# Patient Record
Sex: Female | Born: 1950
Health system: Southern US, Community
[De-identification: ages and names within clinical notes are randomized; demographics above are authoritative.]

## PROBLEM LIST (undated history)

## (undated) DIAGNOSIS — M109 Gout, unspecified: Secondary | ICD-10-CM

## (undated) DIAGNOSIS — E7800 Pure hypercholesterolemia, unspecified: Secondary | ICD-10-CM

## (undated) DIAGNOSIS — I10 Essential (primary) hypertension: Secondary | ICD-10-CM

## (undated) DIAGNOSIS — E119 Type 2 diabetes mellitus without complications: Secondary | ICD-10-CM

## (undated) HISTORY — PX: CHOLECYSTECTOMY: SHX55

## (undated) HISTORY — DX: Essential (primary) hypertension: I10

## (undated) HISTORY — DX: Pure hypercholesterolemia, unspecified: E78.00

---

## 2000-10-26 ENCOUNTER — Ambulatory Visit (HOSPITAL_COMMUNITY): Admission: RE | Admit: 2000-10-26 | Discharge: 2000-10-26 | Payer: Self-pay | Admitting: Family Medicine

## 2000-10-26 ENCOUNTER — Encounter: Payer: Self-pay | Admitting: Family Medicine

## 2001-01-10 HISTORY — PX: COLONOSCOPY: SHX174

## 2001-05-15 ENCOUNTER — Ambulatory Visit (HOSPITAL_COMMUNITY): Admission: RE | Admit: 2001-05-15 | Discharge: 2001-05-15 | Payer: Self-pay | Admitting: Internal Medicine

## 2002-08-23 ENCOUNTER — Encounter: Payer: Self-pay | Admitting: Family Medicine

## 2002-08-23 ENCOUNTER — Ambulatory Visit (HOSPITAL_COMMUNITY): Admission: RE | Admit: 2002-08-23 | Discharge: 2002-08-23 | Payer: Self-pay | Admitting: Family Medicine

## 2003-06-10 ENCOUNTER — Encounter (HOSPITAL_COMMUNITY): Admission: RE | Admit: 2003-06-10 | Discharge: 2003-06-11 | Payer: Self-pay | Admitting: Family Medicine

## 2005-03-26 ENCOUNTER — Ambulatory Visit (HOSPITAL_COMMUNITY): Admission: RE | Admit: 2005-03-26 | Discharge: 2005-03-26 | Payer: Self-pay | Admitting: Internal Medicine

## 2005-08-16 ENCOUNTER — Ambulatory Visit (HOSPITAL_COMMUNITY): Admission: RE | Admit: 2005-08-16 | Discharge: 2005-08-16 | Payer: Self-pay | Admitting: Family Medicine

## 2007-09-21 ENCOUNTER — Ambulatory Visit (HOSPITAL_COMMUNITY): Admission: RE | Admit: 2007-09-21 | Discharge: 2007-09-21 | Payer: Self-pay | Admitting: Family Medicine

## 2008-10-31 ENCOUNTER — Ambulatory Visit (HOSPITAL_COMMUNITY): Admission: RE | Admit: 2008-10-31 | Discharge: 2008-10-31 | Payer: Self-pay | Admitting: Family Medicine

## 2010-05-28 NOTE — Op Note (Signed)
[facility]  Patient:    Beverly Foley, Beverly Foley Visit Number: [id number] MRN: [id number]          Service Type: DSU Location: DAY Attending Physician:  Jonathon Bellows Dictated by:   Roetta Sessions, M.D. Proc. Date: 05/15/01 Admit Date:  05/15/2001   CC:         [name], M.D., [place], [place].   Operative Report  PROCEDURE:  Colonoscopy with biopsy.  INDICATIONS FOR PROCEDURE:  The patient is a 60 year old lady kindly referred through the courtesy of Dr. [name] to further evaluate Hemoccult positive stool and to take a recent rectal examination. Beverly Foley tells me she has seen small volume of blood per rectum with stooling on occasion for a year or so. She has occasional diarrhea but has generally normal formed bowel movements. Colonoscopy is now being done. This approach has been discussed with Beverly Foley. The potential risks, benefits, and alternatives have been reviewed and questions answered. She is low risk for conscious sedation. Please see my May 02, 2001 consultation note for more information.  MONITORING:  O2 saturations, blood pressure, pulse and respirations were monitored throughout the entire procedure.  CONSCIOUS SEDATION:  Versed 3 mg IV, Demerol 75 IV in divided doses.  INSTRUMENT:  Olympus video chip colonoscope.  FINDINGS:  Digital rectal exam revealed no abnormalities.  ENDOSCOPIC FINDINGS:  The prep was good.  RECTUM:  Examination of the rectal mucosal including retroflexed view of the anal verge revealed a couple of anal papillae and internal hemorrhoids otherwise the rectal mucosa appeared normal.  COLON:  The colonic mucosa was surveyed from the rectosigmoid junction through the left transverse right colon to the area of the appendiceal orifice, ileocecal valve, and cecum. These structures were well seen and appeared normal as did the remainder of the colon although I do note a little more friability than is  normally seen. Again vascular pattern of the colon was maintained. There were no focal areas of erosion, ulceration or other abnormality. From the level of the cecum and ileocecal valve, the scope was slowly withdrawn. All previously mentioned mucosal surfaces were again seen, no abnormalities were observed. I elected to take two biopsies of the sigmoid colon to rule out microscopic colitis. The patient tolerated the procedure well and was reactive in endoscopy.  IMPRESSION:  1. Internal hemorrhoids/anal papillae. Otherwise normal rectum.  2. Endoscopically normal appearing colonic mucosa all the way to the     cecum although there was some increased friability diffusely. Biopsy     of the left colon taken as described above.  RECOMMENDATIONS:  1. Go ahead and treat her empirically for hemorrhoids.  2. Daily Metamucil or Citrucel fiber supplement.  3. Anusol-HC suppositories one per rectum at bedtime. Follow-up on the     path.  4. Further recommendations to follow. Dictated by:   Roetta Sessions, M.D. Attending Physician:  Jonathon Bellows DD:  05/15/01 TD:  05/16/01 Job: [id number]

## 2011-09-14 ENCOUNTER — Other Ambulatory Visit (HOSPITAL_COMMUNITY): Payer: Self-pay | Admitting: Family Medicine

## 2011-09-14 DIAGNOSIS — R221 Localized swelling, mass and lump, neck: Secondary | ICD-10-CM

## 2011-09-16 ENCOUNTER — Other Ambulatory Visit (HOSPITAL_COMMUNITY): Payer: Self-pay | Admitting: Family Medicine

## 2011-09-16 ENCOUNTER — Ambulatory Visit (HOSPITAL_COMMUNITY)
Admission: RE | Admit: 2011-09-16 | Discharge: 2011-09-16 | Disposition: A | Discharge status: Home / Self Care | Payer: BC Managed Care – PPO | Source: Ambulatory Visit | Attending: Family Medicine | Admitting: Family Medicine

## 2011-09-16 DIAGNOSIS — R220 Localized swelling, mass and lump, head: Secondary | ICD-10-CM | POA: Insufficient documentation

## 2011-09-16 DIAGNOSIS — R221 Localized swelling, mass and lump, neck: Secondary | ICD-10-CM

## 2012-05-30 ENCOUNTER — Emergency Department (HOSPITAL_COMMUNITY): Payer: Worker's Compensation

## 2012-05-30 ENCOUNTER — Encounter (HOSPITAL_COMMUNITY): Payer: Self-pay

## 2012-05-30 ENCOUNTER — Emergency Department (HOSPITAL_COMMUNITY)
Admission: EM | Admit: 2012-05-30 | Discharge: 2012-05-30 | Disposition: A | Discharge status: Home / Self Care | Payer: Worker's Compensation | Attending: Emergency Medicine | Admitting: Emergency Medicine

## 2012-05-30 DIAGNOSIS — S52123A Displaced fracture of head of unspecified radius, initial encounter for closed fracture: Secondary | ICD-10-CM | POA: Insufficient documentation

## 2012-05-30 DIAGNOSIS — Y9302 Activity, running: Secondary | ICD-10-CM | POA: Insufficient documentation

## 2012-05-30 DIAGNOSIS — Z79899 Other long term (current) drug therapy: Secondary | ICD-10-CM | POA: Insufficient documentation

## 2012-05-30 DIAGNOSIS — E119 Type 2 diabetes mellitus without complications: Secondary | ICD-10-CM | POA: Insufficient documentation

## 2012-05-30 DIAGNOSIS — Y9239 Other specified sports and athletic area as the place of occurrence of the external cause: Secondary | ICD-10-CM | POA: Insufficient documentation

## 2012-05-30 DIAGNOSIS — W010XXA Fall on same level from slipping, tripping and stumbling without subsequent striking against object, initial encounter: Secondary | ICD-10-CM | POA: Insufficient documentation

## 2012-05-30 DIAGNOSIS — S52121A Displaced fracture of head of right radius, initial encounter for closed fracture: Secondary | ICD-10-CM

## 2012-05-30 HISTORY — DX: Type 2 diabetes mellitus without complications: E11.9

## 2012-05-30 MED ORDER — HYDROCODONE-ACETAMINOPHEN 5-325 MG PO TABS: ORAL_TABLET | ORAL | Status: DC

## 2012-05-30 NOTE — ED Notes (Signed)
Pt states she was running and fell on her right arm.

## 2012-05-30 NOTE — ED Provider Notes (Signed)
History     CSN: [id number]  Arrival date & time 05/30/12  1308   First MD Initiated Contact with Patient 05/30/12 1949      Chief Complaint  Patient presents with  . Arm Injury     HPI Pt was seen at 2000.  Per pt, c/o gradual onset and persistence of constant right elbow and forearm "pain" that began this afternoon PTA.  Pt states she was "playing ball" with some children and was running when she tripped and fell, landing on her outstretched right arm.  Pt is right handed.  Denies shoulder/wrist/hand pain, no neck pain, no head injury/LOC, no CP/SOB, no abd pain, no focal motor weakness, no tingling/numbness in extremities.      Past Medical History  Diagnosis Date  . Diabetes mellitus without complication     Past Surgical History  Procedure Laterality Date  . Cholecystectomy      History  Substance Use Topics  . Smoking status: Never Smoker   . Smokeless tobacco: Not on file  . Alcohol Use: No      Review of Systems ROS: Statement: All systems negative except as marked or noted in the HPI; Constitutional: Negative for fever and chills. ; ; Eyes: Negative for eye pain, redness and discharge. ; ; ENMT: Negative for ear pain, hoarseness, nasal congestion, sinus pressure and sore throat. ; ; Cardiovascular: Negative for chest pain, palpitations, diaphoresis, dyspnea and peripheral edema. ; ; Respiratory: Negative for cough, wheezing and stridor. ; ; Gastrointestinal: Negative for nausea, vomiting, diarrhea, abdominal pain, blood in stool, hematemesis, jaundice and rectal bleeding. . ; ; Genitourinary: Negative for dysuria, flank pain and hematuria. ; ; Musculoskeletal: +right forearm and elbow pain. Negative for back pain and neck pain. Negative for swelling and deformity.; ; Skin: Negative for pruritus, rash, abrasions, blisters, bruising and skin lesion.; ; Neuro: Negative for headache, lightheadedness and neck stiffness. Negative for weakness, altered level of consciousness  , altered mental status, extremity weakness, paresthesias, involuntary movement, seizure and syncope.       Allergies  Augmentin; Codeine; and Levaquin  Home Medications   Current Outpatient Rx  Name  Route  Sig  Dispense  Refill  . Canagliflozin (INVOKANA) 100 MG TABS   Oral   Take 1 tablet by mouth daily.         Marland Kitchen lisinopril-hydrochlorothiazide (PRINZIDE,ZESTORETIC) 20-25 MG per tablet   Oral   Take 1 tablet by mouth daily.         . Saxagliptin-Metformin (KOMBIGLYZE XR) 2.05-998 MG TB24   Oral   Take 1 tablet by mouth 2 (two) times daily.         . vitamin C (ASCORBIC ACID) 500 MG tablet   Oral   Take 500 mg by mouth daily.           BP 132/78  Pulse 91  Temp(Src) 98.2 F (36.8 C) (Oral)  Resp 20  Ht 5\' 1"  (1.549 m)  Wt 226 lb (102.513 kg)  BMI 42.72 kg/m2  SpO2 97%  Physical Exam 2005: Physical examination:  Nursing notes reviewed; Vital signs and O2 SAT reviewed;  Constitutional: Well developed, Well nourished, Well hydrated, In no acute distress; Head:  Normocephalic, atraumatic; Eyes: EOMI, PERRL, No scleral icterus; ENMT: Mouth and pharynx normal, Mucous membranes moist; Neck: Supple, Full range of motion, No lymphadenopathy; Cardiovascular: Regular rate and rhythm, No gallop; Respiratory: Breath sounds clear & equal bilaterally, No rales, rhonchi, wheezes.  Speaking full sentences with ease, Normal respiratory  effort/excursion; Chest: Nontender, Movement normal; Abdomen: Soft, Nontender, Nondistended, Normal bowel sounds;; Extremities: Pulses normal, +TTP right proximal radial forearm area as well as right lateral elbow, pain increases with pronation/supination of right wrist. NMS intact right hand. Strong radial pulse. NT right clavicle/shoulder/wrist/hand. No open wounds, no deformity, no ecchymosis, no erythema, no edema.; Neuro: AA&Ox3, Major CN grossly intact.  Speech clear. Climbs on and off stretcher easily by herself. Gait steady. No gross focal  motor or sensory deficits in extremities.; Skin: Color normal, Warm, Dry.    ED Course  Procedures    MDM  MDM Reviewed: previous chart, nursing note and vitals Interpretation: x-ray   Dg Elbow Complete Right 05/30/2012   *RADIOLOGY REPORT*  Clinical Data: Fall  RIGHT ELBOW - COMPLETE 3+ VIEW  Comparison: None.  Findings: Fracture the radial head with mild displacement.  No other fracture or degenerative change.  IMPRESSION: Radial head fracture.   Original Report Authenticated By: Janeece Riggers, M.D.   Dg Forearm Right 05/30/2012   *RADIOLOGY REPORT*  Clinical Data:   Fall  RIGHT FOREARM - 2 VIEW  Comparison:  None.  Findings: There is no evidence of fracture or other focal bone lesions.  Soft tissues are unremarkable.  IMPRESSION: Negative.   Original Report Authenticated By: Janeece Riggers, M.D.     2045:  Will apply long arm posterior splint/sling; f/u Ortho MD.  Dx and testing d/w pt.  Questions answered.  Verb understanding, agreeable to d/c home with outpt f/u.       Laray Anger, DO 06/01/12 1821

## 2013-04-10 ENCOUNTER — Other Ambulatory Visit (HOSPITAL_COMMUNITY): Payer: Self-pay | Admitting: Family Medicine

## 2013-04-10 ENCOUNTER — Ambulatory Visit (HOSPITAL_COMMUNITY)
Admission: RE | Admit: 2013-04-10 | Discharge: 2013-04-10 | Disposition: A | Discharge status: Home / Self Care | Payer: BC Managed Care – PPO | Source: Ambulatory Visit | Attending: Family Medicine | Admitting: Family Medicine

## 2013-04-10 DIAGNOSIS — M25569 Pain in unspecified knee: Secondary | ICD-10-CM | POA: Insufficient documentation

## 2013-04-10 DIAGNOSIS — R52 Pain, unspecified: Secondary | ICD-10-CM

## 2013-04-10 DIAGNOSIS — I10 Essential (primary) hypertension: Secondary | ICD-10-CM | POA: Insufficient documentation

## 2013-04-10 DIAGNOSIS — M898X9 Other specified disorders of bone, unspecified site: Secondary | ICD-10-CM | POA: Insufficient documentation

## 2014-11-14 IMAGING — CR DG CHEST 2V
2 series · 2 of 2 positions shown · non-contrast
Comparison: 08/16/2005

CLINICAL DATA: Routine physical exam

EXAM:
CHEST  2 VIEW

[view not recorded (1 of 2)]
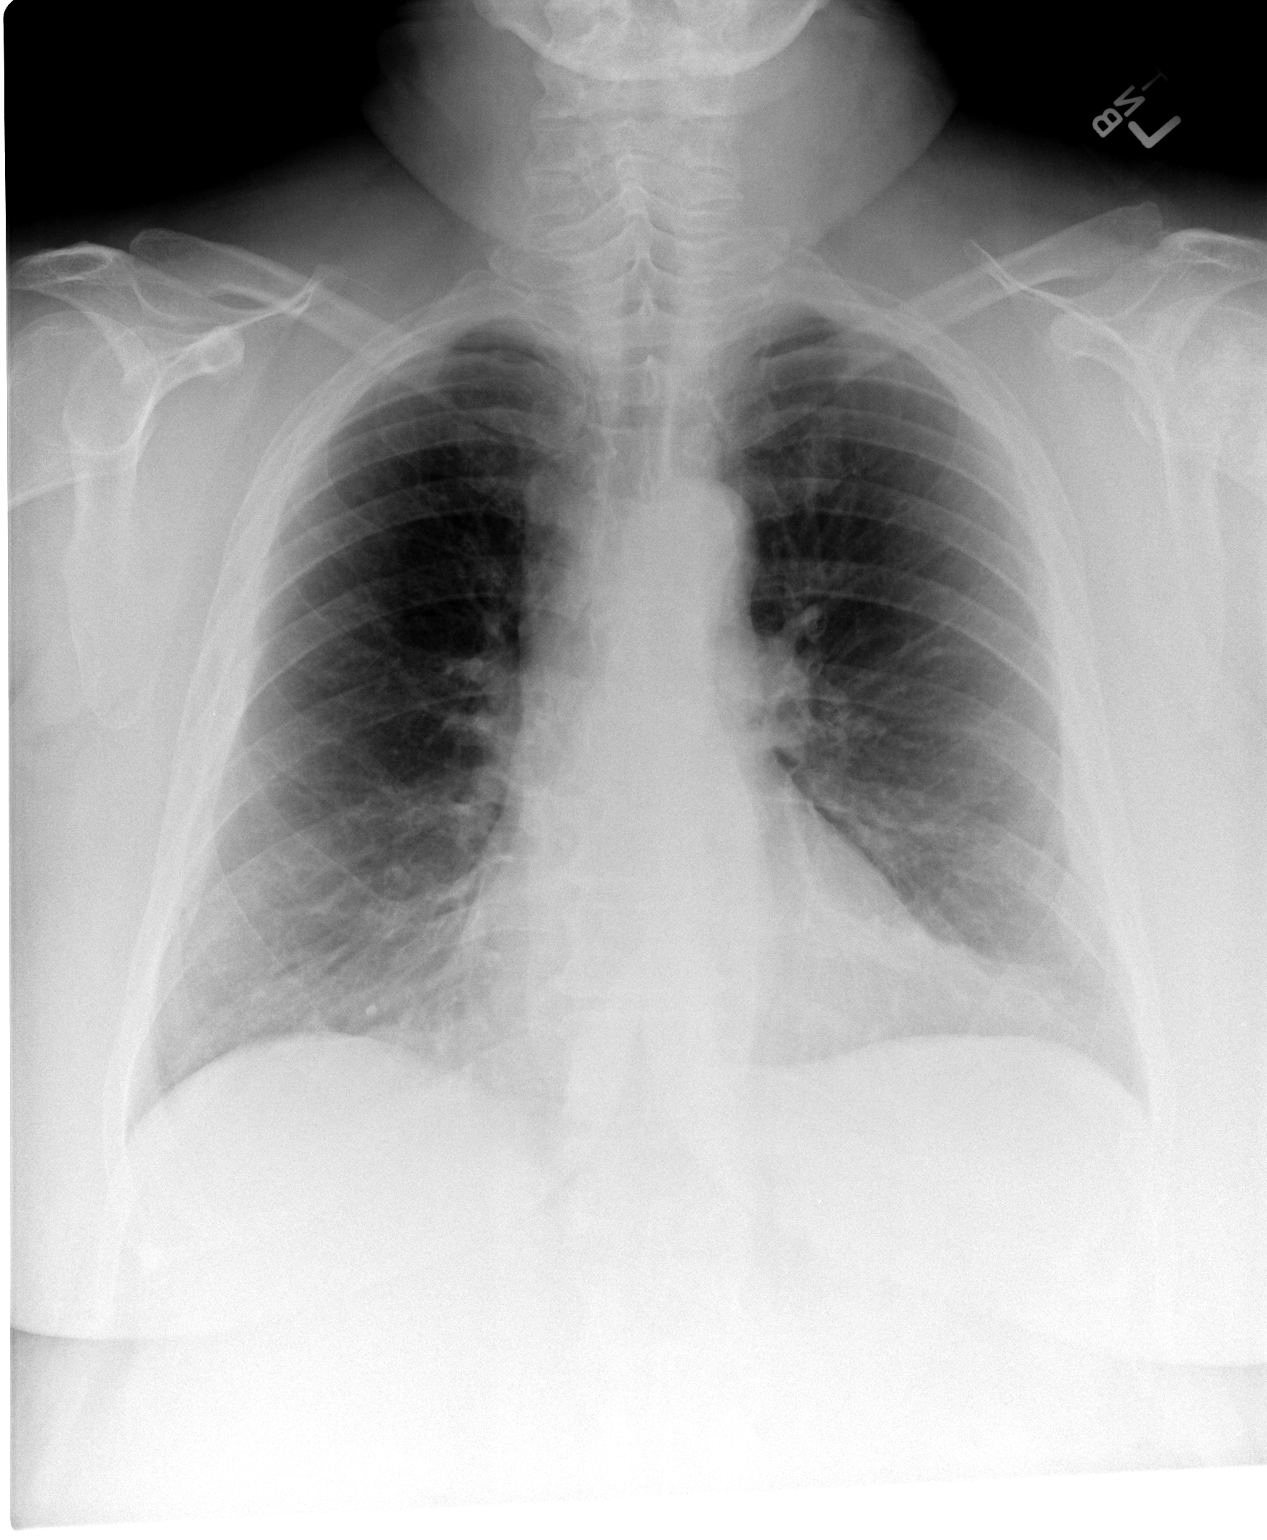

[view not recorded (2 of 2)]
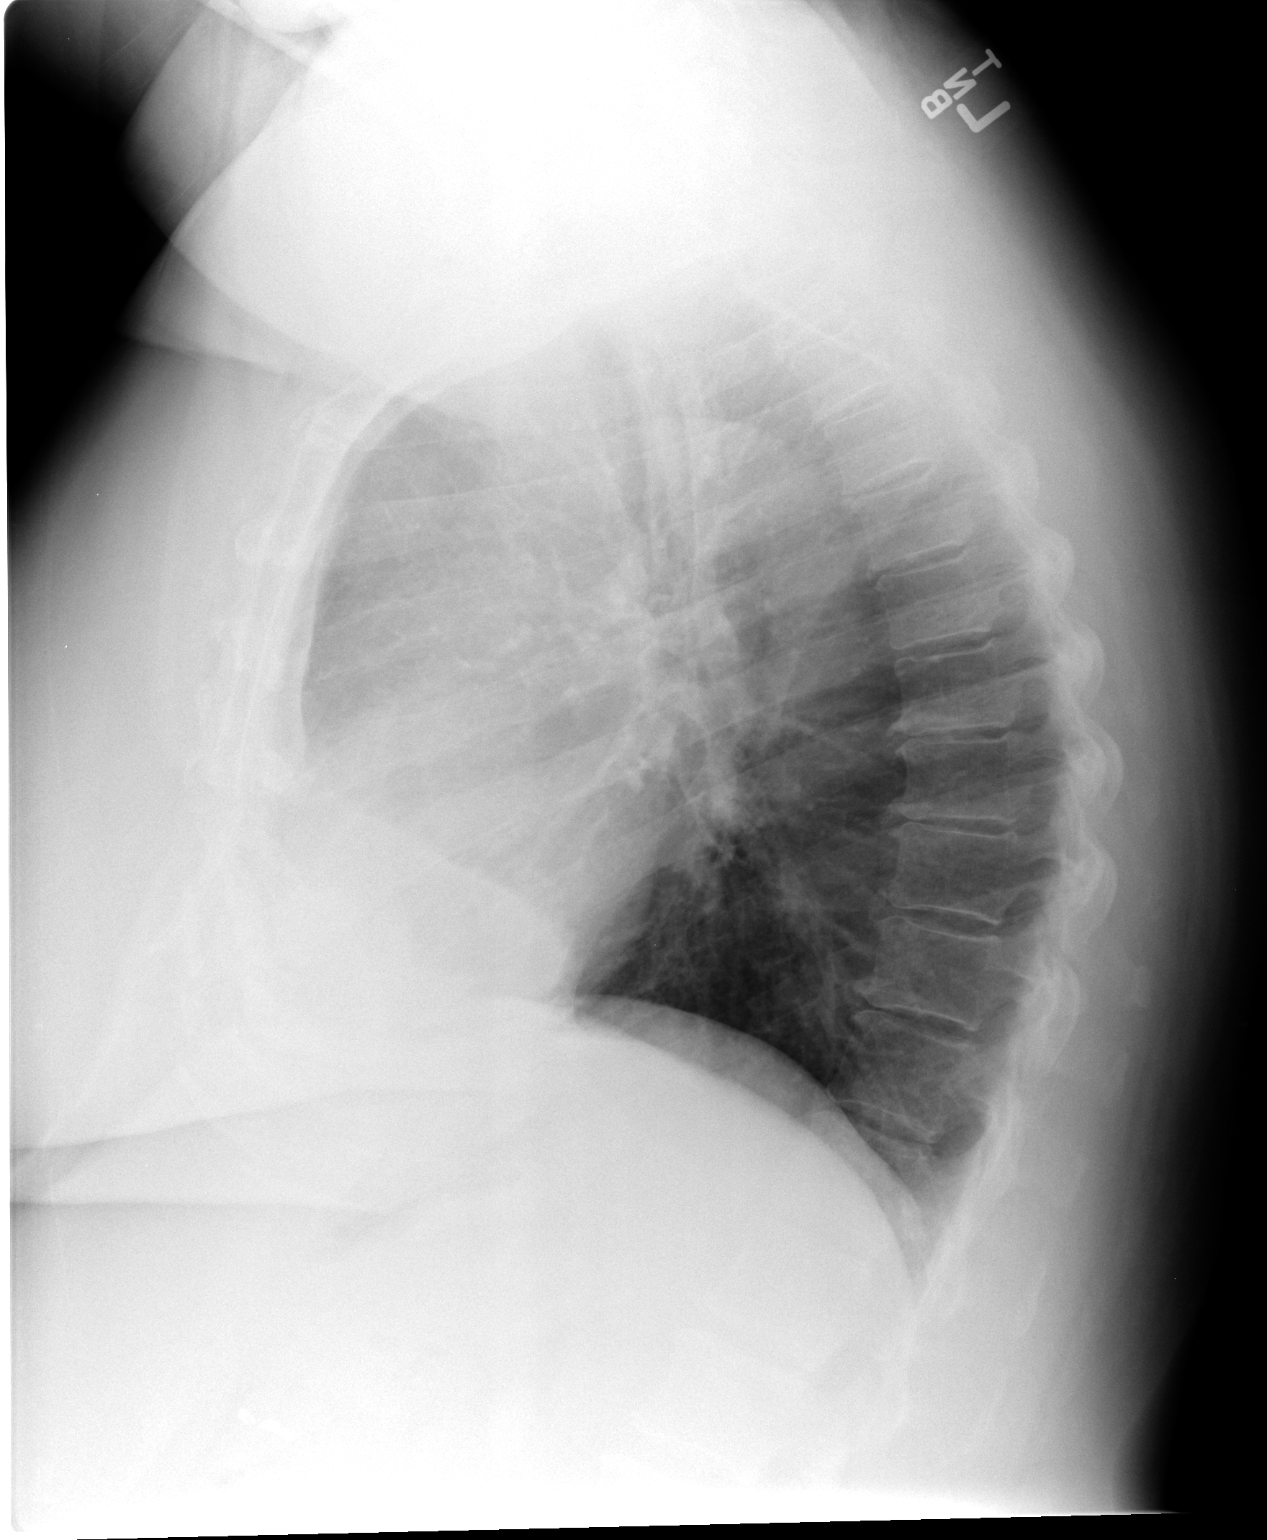

[2 of 2 positions shown; findings below may reference images not displayed]

FINDINGS: The heart size and mediastinal contours are within normal limits.
Both lungs are clear. The visualized skeletal structures are
unremarkable.
IMPRESSION: No active cardiopulmonary disease.

## 2015-05-28 ENCOUNTER — Ambulatory Visit (INDEPENDENT_AMBULATORY_CARE_PROVIDER_SITE_OTHER): Payer: BC Managed Care – PPO | Admitting: Otolaryngology

## 2015-05-28 DIAGNOSIS — K219 Gastro-esophageal reflux disease without esophagitis: Secondary | ICD-10-CM | POA: Diagnosis not present

## 2015-05-28 DIAGNOSIS — R490 Dysphonia: Secondary | ICD-10-CM | POA: Diagnosis not present

## 2015-05-28 DIAGNOSIS — D440 Neoplasm of uncertain behavior of thyroid gland: Secondary | ICD-10-CM

## 2015-10-26 DIAGNOSIS — I10 Essential (primary) hypertension: Secondary | ICD-10-CM | POA: Diagnosis not present

## 2015-10-26 DIAGNOSIS — E041 Nontoxic single thyroid nodule: Secondary | ICD-10-CM | POA: Diagnosis not present

## 2015-10-26 DIAGNOSIS — E1165 Type 2 diabetes mellitus with hyperglycemia: Secondary | ICD-10-CM | POA: Diagnosis not present

## 2015-10-28 DIAGNOSIS — I10 Essential (primary) hypertension: Secondary | ICD-10-CM | POA: Diagnosis not present

## 2015-10-28 DIAGNOSIS — E041 Nontoxic single thyroid nodule: Secondary | ICD-10-CM | POA: Diagnosis not present

## 2015-10-28 DIAGNOSIS — Z6839 Body mass index (BMI) 39.0-39.9, adult: Secondary | ICD-10-CM | POA: Diagnosis not present

## 2015-10-28 DIAGNOSIS — Z23 Encounter for immunization: Secondary | ICD-10-CM | POA: Diagnosis not present

## 2015-10-28 DIAGNOSIS — E1165 Type 2 diabetes mellitus with hyperglycemia: Secondary | ICD-10-CM | POA: Diagnosis not present

## 2015-10-28 DIAGNOSIS — M109 Gout, unspecified: Secondary | ICD-10-CM | POA: Diagnosis not present

## 2016-02-22 DIAGNOSIS — I10 Essential (primary) hypertension: Secondary | ICD-10-CM | POA: Diagnosis not present

## 2016-02-22 DIAGNOSIS — E1165 Type 2 diabetes mellitus with hyperglycemia: Secondary | ICD-10-CM | POA: Diagnosis not present

## 2016-02-22 DIAGNOSIS — E041 Nontoxic single thyroid nodule: Secondary | ICD-10-CM | POA: Diagnosis not present

## 2016-02-22 DIAGNOSIS — L0390 Cellulitis, unspecified: Secondary | ICD-10-CM | POA: Diagnosis not present

## 2016-03-02 DIAGNOSIS — E041 Nontoxic single thyroid nodule: Secondary | ICD-10-CM | POA: Diagnosis not present

## 2016-03-02 DIAGNOSIS — E782 Mixed hyperlipidemia: Secondary | ICD-10-CM | POA: Diagnosis not present

## 2016-03-02 DIAGNOSIS — E1165 Type 2 diabetes mellitus with hyperglycemia: Secondary | ICD-10-CM | POA: Diagnosis not present

## 2016-03-02 DIAGNOSIS — Z6841 Body Mass Index (BMI) 40.0 and over, adult: Secondary | ICD-10-CM | POA: Diagnosis not present

## 2016-03-02 DIAGNOSIS — M109 Gout, unspecified: Secondary | ICD-10-CM | POA: Diagnosis not present

## 2016-03-02 DIAGNOSIS — I10 Essential (primary) hypertension: Secondary | ICD-10-CM | POA: Diagnosis not present

## 2016-05-11 ENCOUNTER — Other Ambulatory Visit (INDEPENDENT_AMBULATORY_CARE_PROVIDER_SITE_OTHER): Payer: Self-pay | Admitting: Otolaryngology

## 2016-05-11 DIAGNOSIS — E041 Nontoxic single thyroid nodule: Secondary | ICD-10-CM

## 2016-05-20 ENCOUNTER — Ambulatory Visit (HOSPITAL_COMMUNITY)
Admission: RE | Admit: 2016-05-20 | Discharge: 2016-05-20 | Disposition: A | Discharge status: Home / Self Care | Payer: Medicare Other | Source: Ambulatory Visit | Attending: Otolaryngology | Admitting: Otolaryngology

## 2016-05-20 DIAGNOSIS — E042 Nontoxic multinodular goiter: Secondary | ICD-10-CM | POA: Insufficient documentation

## 2016-05-20 DIAGNOSIS — E041 Nontoxic single thyroid nodule: Secondary | ICD-10-CM | POA: Diagnosis not present

## 2016-06-20 ENCOUNTER — Ambulatory Visit (INDEPENDENT_AMBULATORY_CARE_PROVIDER_SITE_OTHER): Payer: Medicare Other | Admitting: Otolaryngology

## 2016-06-20 DIAGNOSIS — R490 Dysphonia: Secondary | ICD-10-CM

## 2016-06-20 DIAGNOSIS — K219 Gastro-esophageal reflux disease without esophagitis: Secondary | ICD-10-CM | POA: Diagnosis not present

## 2016-06-20 DIAGNOSIS — D440 Neoplasm of uncertain behavior of thyroid gland: Secondary | ICD-10-CM | POA: Diagnosis not present

## 2016-06-23 DIAGNOSIS — M109 Gout, unspecified: Secondary | ICD-10-CM | POA: Diagnosis not present

## 2016-06-23 DIAGNOSIS — I10 Essential (primary) hypertension: Secondary | ICD-10-CM | POA: Diagnosis not present

## 2016-06-23 DIAGNOSIS — E1165 Type 2 diabetes mellitus with hyperglycemia: Secondary | ICD-10-CM | POA: Diagnosis not present

## 2016-06-23 DIAGNOSIS — E041 Nontoxic single thyroid nodule: Secondary | ICD-10-CM | POA: Diagnosis not present

## 2016-06-23 DIAGNOSIS — E782 Mixed hyperlipidemia: Secondary | ICD-10-CM | POA: Diagnosis not present

## 2016-06-29 DIAGNOSIS — M109 Gout, unspecified: Secondary | ICD-10-CM | POA: Diagnosis not present

## 2016-06-29 DIAGNOSIS — K219 Gastro-esophageal reflux disease without esophagitis: Secondary | ICD-10-CM | POA: Diagnosis not present

## 2016-06-29 DIAGNOSIS — E041 Nontoxic single thyroid nodule: Secondary | ICD-10-CM | POA: Diagnosis not present

## 2016-06-29 DIAGNOSIS — I10 Essential (primary) hypertension: Secondary | ICD-10-CM | POA: Diagnosis not present

## 2016-06-29 DIAGNOSIS — E1165 Type 2 diabetes mellitus with hyperglycemia: Secondary | ICD-10-CM | POA: Diagnosis not present

## 2016-06-29 DIAGNOSIS — E782 Mixed hyperlipidemia: Secondary | ICD-10-CM | POA: Diagnosis not present

## 2016-06-29 DIAGNOSIS — Z1389 Encounter for screening for other disorder: Secondary | ICD-10-CM | POA: Diagnosis not present

## 2016-06-29 DIAGNOSIS — Z6839 Body mass index (BMI) 39.0-39.9, adult: Secondary | ICD-10-CM | POA: Diagnosis not present

## 2016-08-11 DIAGNOSIS — Z6841 Body Mass Index (BMI) 40.0 and over, adult: Secondary | ICD-10-CM | POA: Diagnosis not present

## 2016-08-11 DIAGNOSIS — M109 Gout, unspecified: Secondary | ICD-10-CM | POA: Diagnosis not present

## 2016-10-24 DIAGNOSIS — M109 Gout, unspecified: Secondary | ICD-10-CM | POA: Diagnosis not present

## 2016-10-24 DIAGNOSIS — E041 Nontoxic single thyroid nodule: Secondary | ICD-10-CM | POA: Diagnosis not present

## 2016-10-24 DIAGNOSIS — I10 Essential (primary) hypertension: Secondary | ICD-10-CM | POA: Diagnosis not present

## 2016-10-24 DIAGNOSIS — E1165 Type 2 diabetes mellitus with hyperglycemia: Secondary | ICD-10-CM | POA: Diagnosis not present

## 2016-10-24 DIAGNOSIS — E782 Mixed hyperlipidemia: Secondary | ICD-10-CM | POA: Diagnosis not present

## 2016-10-24 DIAGNOSIS — K219 Gastro-esophageal reflux disease without esophagitis: Secondary | ICD-10-CM | POA: Diagnosis not present

## 2016-10-28 DIAGNOSIS — Z23 Encounter for immunization: Secondary | ICD-10-CM | POA: Diagnosis not present

## 2016-10-28 DIAGNOSIS — K219 Gastro-esophageal reflux disease without esophagitis: Secondary | ICD-10-CM | POA: Diagnosis not present

## 2016-10-28 DIAGNOSIS — E041 Nontoxic single thyroid nodule: Secondary | ICD-10-CM | POA: Diagnosis not present

## 2016-10-28 DIAGNOSIS — I10 Essential (primary) hypertension: Secondary | ICD-10-CM | POA: Diagnosis not present

## 2016-10-28 DIAGNOSIS — Z6839 Body mass index (BMI) 39.0-39.9, adult: Secondary | ICD-10-CM | POA: Diagnosis not present

## 2016-10-28 DIAGNOSIS — E1165 Type 2 diabetes mellitus with hyperglycemia: Secondary | ICD-10-CM | POA: Diagnosis not present

## 2016-10-28 DIAGNOSIS — M109 Gout, unspecified: Secondary | ICD-10-CM | POA: Diagnosis not present

## 2016-10-28 DIAGNOSIS — E782 Mixed hyperlipidemia: Secondary | ICD-10-CM | POA: Diagnosis not present

## 2017-01-25 DIAGNOSIS — E1165 Type 2 diabetes mellitus with hyperglycemia: Secondary | ICD-10-CM | POA: Diagnosis not present

## 2017-01-25 DIAGNOSIS — E782 Mixed hyperlipidemia: Secondary | ICD-10-CM | POA: Diagnosis not present

## 2017-01-25 DIAGNOSIS — E041 Nontoxic single thyroid nodule: Secondary | ICD-10-CM | POA: Diagnosis not present

## 2017-01-25 DIAGNOSIS — K219 Gastro-esophageal reflux disease without esophagitis: Secondary | ICD-10-CM | POA: Diagnosis not present

## 2017-01-25 DIAGNOSIS — I10 Essential (primary) hypertension: Secondary | ICD-10-CM | POA: Diagnosis not present

## 2017-08-29 DIAGNOSIS — K219 Gastro-esophageal reflux disease without esophagitis: Secondary | ICD-10-CM | POA: Diagnosis not present

## 2017-08-29 DIAGNOSIS — E1165 Type 2 diabetes mellitus with hyperglycemia: Secondary | ICD-10-CM | POA: Diagnosis not present

## 2017-08-29 DIAGNOSIS — E041 Nontoxic single thyroid nodule: Secondary | ICD-10-CM | POA: Diagnosis not present

## 2017-08-29 DIAGNOSIS — E782 Mixed hyperlipidemia: Secondary | ICD-10-CM | POA: Diagnosis not present

## 2017-08-29 DIAGNOSIS — I10 Essential (primary) hypertension: Secondary | ICD-10-CM | POA: Diagnosis not present

## 2018-08-14 IMAGING — US US THYROID
1 series · 13 of 25 positions shown · non-contrast
Comparison: 05/11/2015, images not immediately available but the
report was reviewed.

CLINICAL DATA: Prior ultrasound follow-up.  Thyroid nodules.

EXAM:
THYROID ULTRASOUND
TECHNIQUE: Ultrasound examination of the thyroid gland and adjacent soft
tissues was performed.

[Series 1: us thyroid · 0.07mm/px · 13 of 73 slices shown]
[im 1/73]
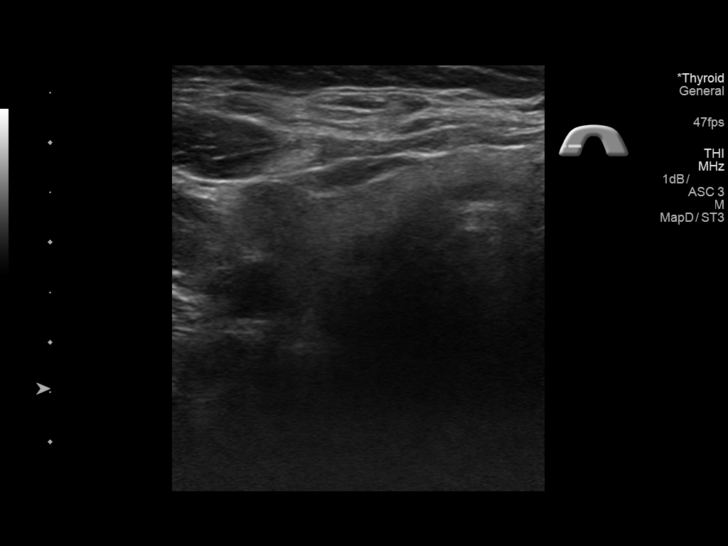
[im 7/73]
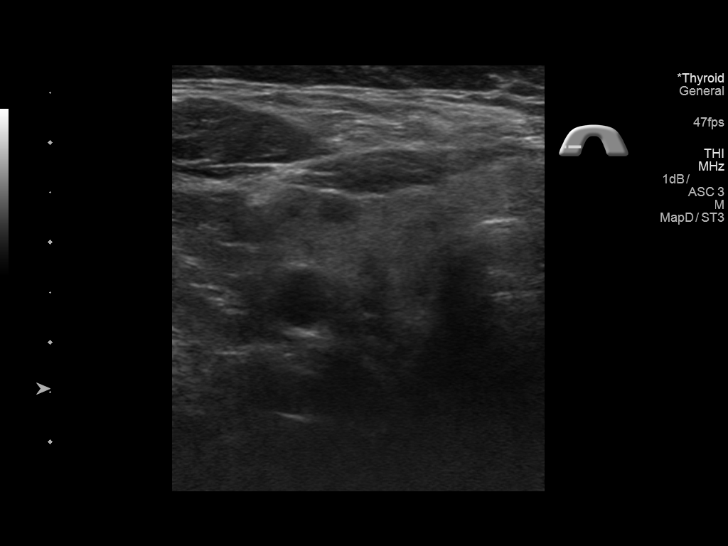
[im 13/73]
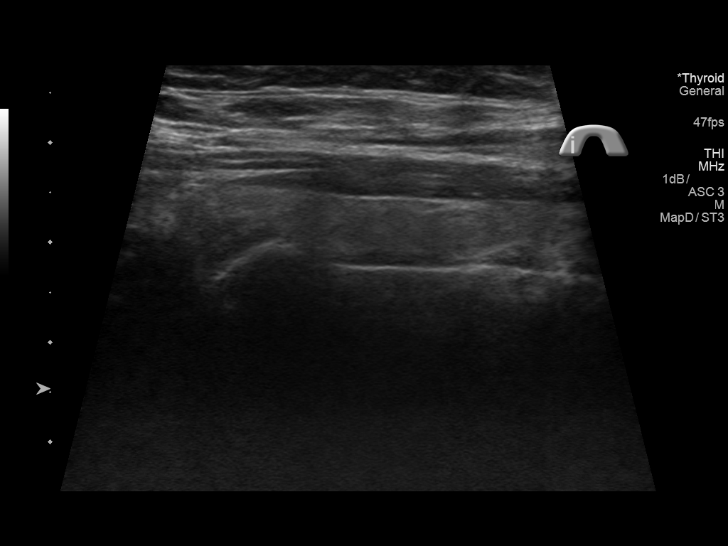
[im 19/73]
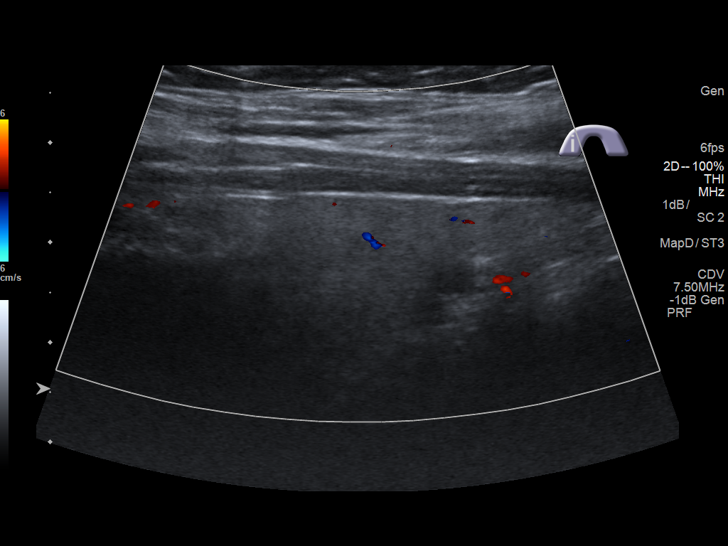
[im 25/73]
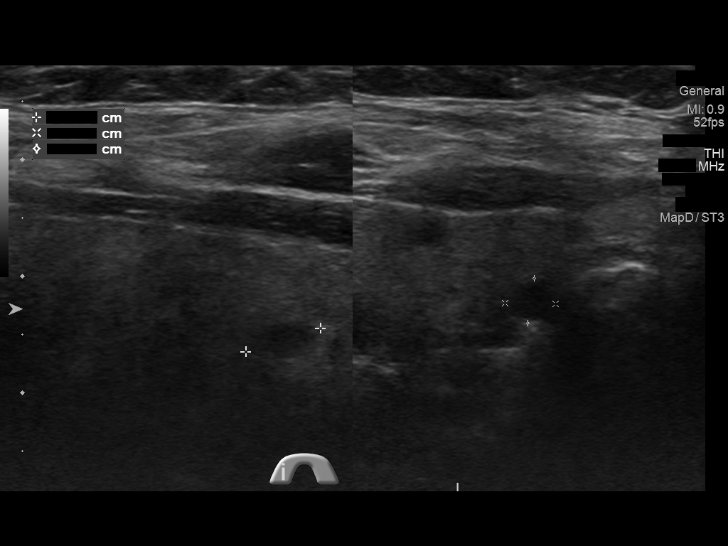
[im 31/73]
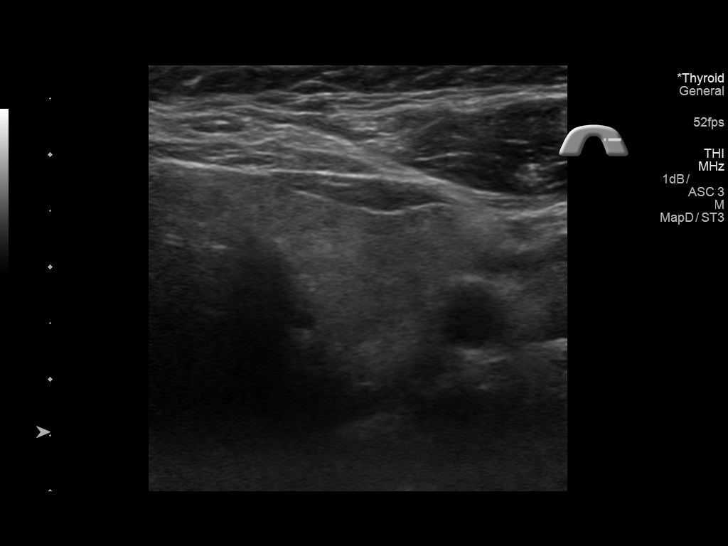
[im 37/73]
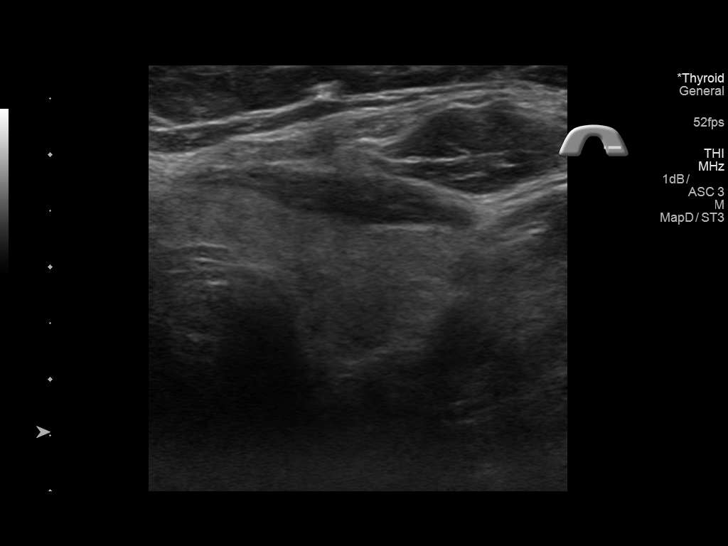
[im 43/73]
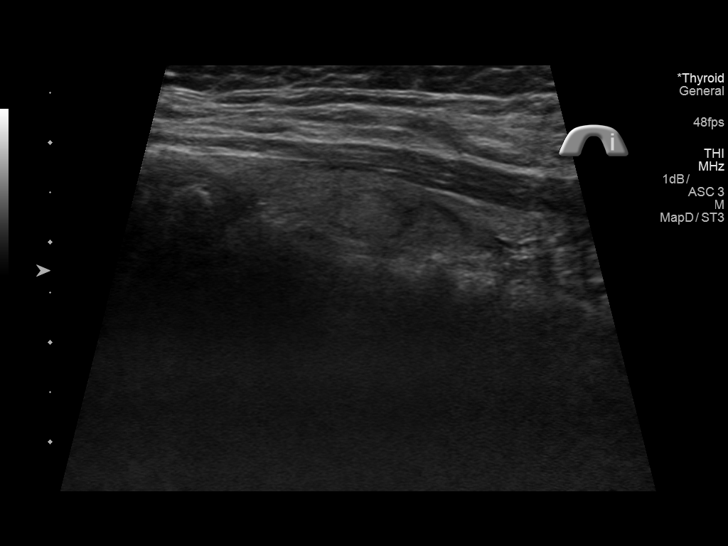
[im 49/73]
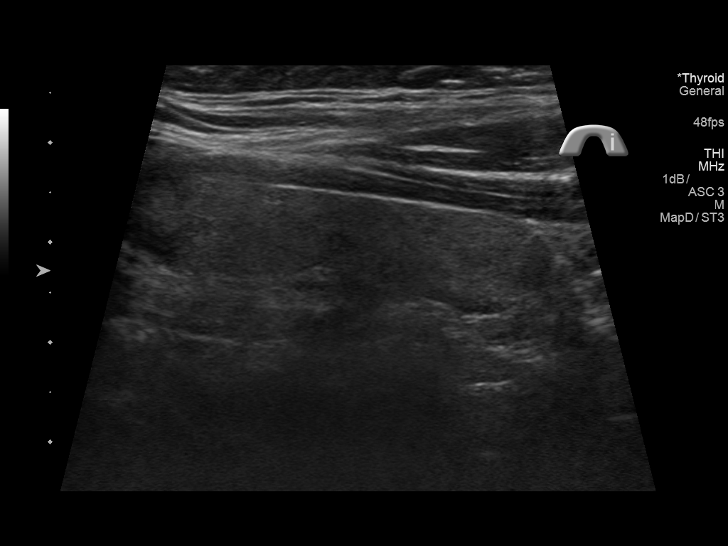
[im 55/73]
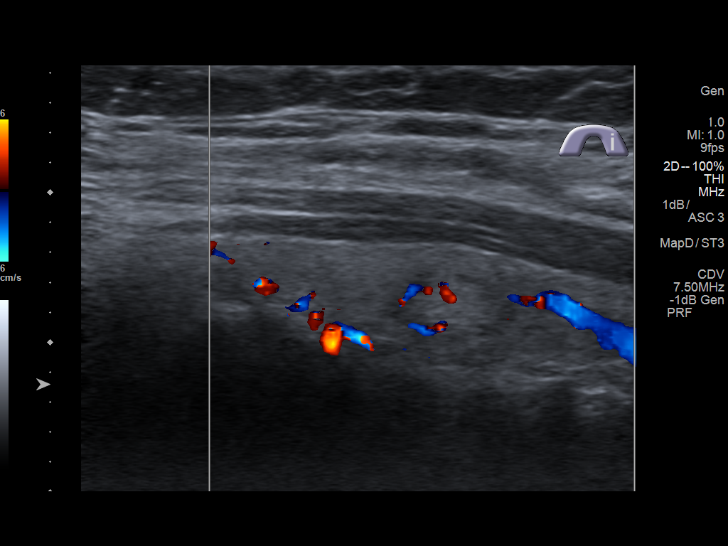
[im 61/73]
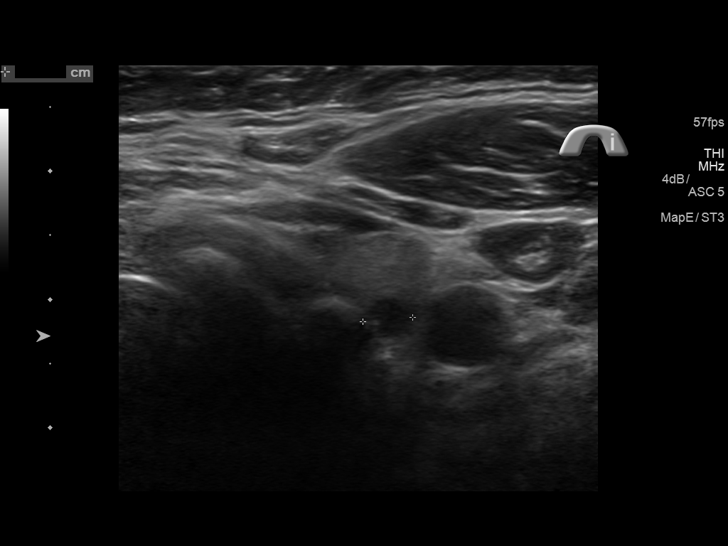
[im 67/73]
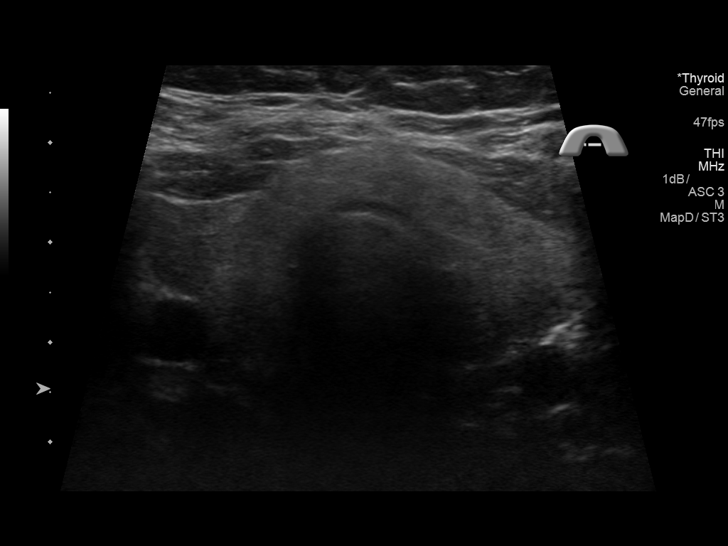
[im 73/73]
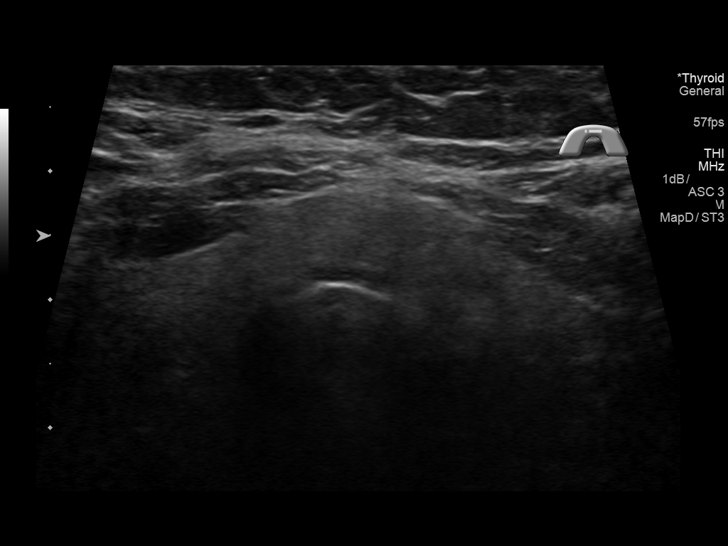

[13 of 25 positions shown; findings below may reference images not displayed]

FINDINGS: Parenchymal Echotexture: Mildly heterogenous

Isthmus: 0.6 cm in the AP dimension

Right lobe: 5.3 x 1.6 x 1.8 cm

Left lobe: 4.9 x 1.5 x 2.0 cm

_________________________________________________________

Estimated total number of nodules >/= 1 cm: 0

Number of spongiform nodules >/=  2 cm not described below (TR1): 0

Number of mixed cystic and solid nodules >/= 1.5 cm not described
below (TR2): 0

_________________________________________________________

0.5 cm hypoechoic nodule in the mid right thyroid lobe does not meet
criteria for biopsy. Hypoechoic nodule in the inferior right thyroid
lobe measures 0.7 cm and does not meet criteria for biopsy.

Nodule # 1:

Location: Left; Mid

Maximum size: 0.9 cm; Other 2 dimensions: 0.6 x 0.9 cm

Composition: solid/almost completely solid (2)

Echogenicity: isoechoic (1)

Shape: not taller-than-wide (0)

Margins: ill-defined (0)

Echogenic foci: none (0)

ACR TI-RADS total points: 3.

ACR TI-RADS risk category: TR3 (3 points).

ACR TI-RADS recommendations:

Given size (<1.4 cm) and appearance, this nodule does NOT meet
TI-RADS criteria for biopsy or dedicated follow-up.

_________________________________________________________

Hypoechoic nodule in the superior left thyroid lobe measures up to
0.5 cm. This nodule does not meet criteria for biopsy.
IMPRESSION: Small bilateral thyroid nodules. These nodules do not meet criteria
for biopsy or dedicated follow-up.

The above is in keeping with the ACR TI-RADS recommendations - [HOSPITAL] 1810;[DATE].

## 2019-01-24 DIAGNOSIS — K219 Gastro-esophageal reflux disease without esophagitis: Secondary | ICD-10-CM | POA: Diagnosis not present

## 2019-01-24 DIAGNOSIS — E782 Mixed hyperlipidemia: Secondary | ICD-10-CM | POA: Diagnosis not present

## 2019-01-24 DIAGNOSIS — I10 Essential (primary) hypertension: Secondary | ICD-10-CM | POA: Diagnosis not present

## 2019-01-24 DIAGNOSIS — E1165 Type 2 diabetes mellitus with hyperglycemia: Secondary | ICD-10-CM | POA: Diagnosis not present

## 2019-01-24 DIAGNOSIS — E041 Nontoxic single thyroid nodule: Secondary | ICD-10-CM | POA: Diagnosis not present

## 2019-01-29 DIAGNOSIS — M109 Gout, unspecified: Secondary | ICD-10-CM | POA: Diagnosis not present

## 2019-01-29 DIAGNOSIS — I10 Essential (primary) hypertension: Secondary | ICD-10-CM | POA: Diagnosis not present

## 2019-01-29 DIAGNOSIS — E1165 Type 2 diabetes mellitus with hyperglycemia: Secondary | ICD-10-CM | POA: Diagnosis not present

## 2019-01-29 DIAGNOSIS — Z0001 Encounter for general adult medical examination with abnormal findings: Secondary | ICD-10-CM | POA: Diagnosis not present

## 2019-01-29 DIAGNOSIS — Z23 Encounter for immunization: Secondary | ICD-10-CM | POA: Diagnosis not present

## 2019-01-29 DIAGNOSIS — E041 Nontoxic single thyroid nodule: Secondary | ICD-10-CM | POA: Diagnosis not present

## 2019-01-29 DIAGNOSIS — Z6836 Body mass index (BMI) 36.0-36.9, adult: Secondary | ICD-10-CM | POA: Diagnosis not present

## 2019-01-29 DIAGNOSIS — K219 Gastro-esophageal reflux disease without esophagitis: Secondary | ICD-10-CM | POA: Diagnosis not present

## 2019-05-24 DIAGNOSIS — E782 Mixed hyperlipidemia: Secondary | ICD-10-CM | POA: Diagnosis not present

## 2019-05-24 DIAGNOSIS — M109 Gout, unspecified: Secondary | ICD-10-CM | POA: Diagnosis not present

## 2019-05-24 DIAGNOSIS — E041 Nontoxic single thyroid nodule: Secondary | ICD-10-CM | POA: Diagnosis not present

## 2019-05-24 DIAGNOSIS — K219 Gastro-esophageal reflux disease without esophagitis: Secondary | ICD-10-CM | POA: Diagnosis not present

## 2019-05-24 DIAGNOSIS — I10 Essential (primary) hypertension: Secondary | ICD-10-CM | POA: Diagnosis not present

## 2019-05-24 DIAGNOSIS — E1165 Type 2 diabetes mellitus with hyperglycemia: Secondary | ICD-10-CM | POA: Diagnosis not present

## 2019-05-29 DIAGNOSIS — Z6836 Body mass index (BMI) 36.0-36.9, adult: Secondary | ICD-10-CM | POA: Diagnosis not present

## 2019-05-29 DIAGNOSIS — E782 Mixed hyperlipidemia: Secondary | ICD-10-CM | POA: Diagnosis not present

## 2019-05-29 DIAGNOSIS — K219 Gastro-esophageal reflux disease without esophagitis: Secondary | ICD-10-CM | POA: Diagnosis not present

## 2019-05-29 DIAGNOSIS — E041 Nontoxic single thyroid nodule: Secondary | ICD-10-CM | POA: Diagnosis not present

## 2019-05-29 DIAGNOSIS — M109 Gout, unspecified: Secondary | ICD-10-CM | POA: Diagnosis not present

## 2019-05-29 DIAGNOSIS — I10 Essential (primary) hypertension: Secondary | ICD-10-CM | POA: Diagnosis not present

## 2019-05-29 DIAGNOSIS — Z23 Encounter for immunization: Secondary | ICD-10-CM | POA: Diagnosis not present

## 2019-05-29 DIAGNOSIS — E1165 Type 2 diabetes mellitus with hyperglycemia: Secondary | ICD-10-CM | POA: Diagnosis not present

## 2019-07-08 DIAGNOSIS — H524 Presbyopia: Secondary | ICD-10-CM | POA: Diagnosis not present

## 2019-08-27 DIAGNOSIS — E1165 Type 2 diabetes mellitus with hyperglycemia: Secondary | ICD-10-CM | POA: Diagnosis not present

## 2019-08-27 DIAGNOSIS — I10 Essential (primary) hypertension: Secondary | ICD-10-CM | POA: Diagnosis not present

## 2019-08-27 DIAGNOSIS — K219 Gastro-esophageal reflux disease without esophagitis: Secondary | ICD-10-CM | POA: Diagnosis not present

## 2019-08-27 DIAGNOSIS — E782 Mixed hyperlipidemia: Secondary | ICD-10-CM | POA: Diagnosis not present

## 2019-09-02 DIAGNOSIS — E041 Nontoxic single thyroid nodule: Secondary | ICD-10-CM | POA: Diagnosis not present

## 2019-09-02 DIAGNOSIS — Z7189 Other specified counseling: Secondary | ICD-10-CM | POA: Diagnosis not present

## 2019-09-02 DIAGNOSIS — I10 Essential (primary) hypertension: Secondary | ICD-10-CM | POA: Diagnosis not present

## 2019-09-02 DIAGNOSIS — E782 Mixed hyperlipidemia: Secondary | ICD-10-CM | POA: Diagnosis not present

## 2019-09-02 DIAGNOSIS — M109 Gout, unspecified: Secondary | ICD-10-CM | POA: Diagnosis not present

## 2019-09-02 DIAGNOSIS — K219 Gastro-esophageal reflux disease without esophagitis: Secondary | ICD-10-CM | POA: Diagnosis not present

## 2019-09-02 DIAGNOSIS — E1165 Type 2 diabetes mellitus with hyperglycemia: Secondary | ICD-10-CM | POA: Diagnosis not present

## 2019-09-02 DIAGNOSIS — Z6836 Body mass index (BMI) 36.0-36.9, adult: Secondary | ICD-10-CM | POA: Diagnosis not present

## 2019-09-05 DIAGNOSIS — Z23 Encounter for immunization: Secondary | ICD-10-CM | POA: Diagnosis not present

## 2019-10-03 DIAGNOSIS — Z23 Encounter for immunization: Secondary | ICD-10-CM | POA: Diagnosis not present

## 2019-12-25 DIAGNOSIS — E782 Mixed hyperlipidemia: Secondary | ICD-10-CM | POA: Diagnosis not present

## 2019-12-25 DIAGNOSIS — E041 Nontoxic single thyroid nodule: Secondary | ICD-10-CM | POA: Diagnosis not present

## 2019-12-25 DIAGNOSIS — I10 Essential (primary) hypertension: Secondary | ICD-10-CM | POA: Diagnosis not present

## 2019-12-25 DIAGNOSIS — K219 Gastro-esophageal reflux disease without esophagitis: Secondary | ICD-10-CM | POA: Diagnosis not present

## 2019-12-25 DIAGNOSIS — E1165 Type 2 diabetes mellitus with hyperglycemia: Secondary | ICD-10-CM | POA: Diagnosis not present

## 2020-01-01 DIAGNOSIS — I10 Essential (primary) hypertension: Secondary | ICD-10-CM | POA: Diagnosis not present

## 2020-01-01 DIAGNOSIS — E1165 Type 2 diabetes mellitus with hyperglycemia: Secondary | ICD-10-CM | POA: Diagnosis not present

## 2020-01-01 DIAGNOSIS — K219 Gastro-esophageal reflux disease without esophagitis: Secondary | ICD-10-CM | POA: Diagnosis not present

## 2020-01-01 DIAGNOSIS — D72829 Elevated white blood cell count, unspecified: Secondary | ICD-10-CM | POA: Diagnosis not present

## 2020-01-01 DIAGNOSIS — M109 Gout, unspecified: Secondary | ICD-10-CM | POA: Diagnosis not present

## 2020-01-01 DIAGNOSIS — Z23 Encounter for immunization: Secondary | ICD-10-CM | POA: Diagnosis not present

## 2020-01-01 DIAGNOSIS — E782 Mixed hyperlipidemia: Secondary | ICD-10-CM | POA: Diagnosis not present

## 2020-01-01 DIAGNOSIS — E041 Nontoxic single thyroid nodule: Secondary | ICD-10-CM | POA: Diagnosis not present

## 2020-03-27 DIAGNOSIS — K219 Gastro-esophageal reflux disease without esophagitis: Secondary | ICD-10-CM | POA: Diagnosis not present

## 2020-03-27 DIAGNOSIS — E782 Mixed hyperlipidemia: Secondary | ICD-10-CM | POA: Diagnosis not present

## 2020-03-27 DIAGNOSIS — M109 Gout, unspecified: Secondary | ICD-10-CM | POA: Diagnosis not present

## 2020-03-27 DIAGNOSIS — E7849 Other hyperlipidemia: Secondary | ICD-10-CM | POA: Diagnosis not present

## 2020-03-27 DIAGNOSIS — E1165 Type 2 diabetes mellitus with hyperglycemia: Secondary | ICD-10-CM | POA: Diagnosis not present

## 2020-03-27 DIAGNOSIS — I10 Essential (primary) hypertension: Secondary | ICD-10-CM | POA: Diagnosis not present

## 2020-03-30 DIAGNOSIS — E041 Nontoxic single thyroid nodule: Secondary | ICD-10-CM | POA: Diagnosis not present

## 2020-03-30 DIAGNOSIS — D72829 Elevated white blood cell count, unspecified: Secondary | ICD-10-CM | POA: Diagnosis not present

## 2020-03-30 DIAGNOSIS — I10 Essential (primary) hypertension: Secondary | ICD-10-CM | POA: Diagnosis not present

## 2020-03-30 DIAGNOSIS — K219 Gastro-esophageal reflux disease without esophagitis: Secondary | ICD-10-CM | POA: Diagnosis not present

## 2020-03-30 DIAGNOSIS — M109 Gout, unspecified: Secondary | ICD-10-CM | POA: Diagnosis not present

## 2020-03-30 DIAGNOSIS — R1012 Left upper quadrant pain: Secondary | ICD-10-CM | POA: Diagnosis not present

## 2020-03-30 DIAGNOSIS — E7849 Other hyperlipidemia: Secondary | ICD-10-CM | POA: Diagnosis not present

## 2020-03-30 DIAGNOSIS — E1165 Type 2 diabetes mellitus with hyperglycemia: Secondary | ICD-10-CM | POA: Diagnosis not present

## 2020-04-06 DIAGNOSIS — Z9049 Acquired absence of other specified parts of digestive tract: Secondary | ICD-10-CM | POA: Diagnosis not present

## 2020-04-06 DIAGNOSIS — R1012 Left upper quadrant pain: Secondary | ICD-10-CM | POA: Diagnosis not present

## 2020-05-14 DIAGNOSIS — R0981 Nasal congestion: Secondary | ICD-10-CM | POA: Diagnosis not present

## 2020-05-25 DIAGNOSIS — Z6838 Body mass index (BMI) 38.0-38.9, adult: Secondary | ICD-10-CM | POA: Diagnosis not present

## 2020-05-25 DIAGNOSIS — J019 Acute sinusitis, unspecified: Secondary | ICD-10-CM | POA: Diagnosis not present

## 2020-05-25 DIAGNOSIS — J029 Acute pharyngitis, unspecified: Secondary | ICD-10-CM | POA: Diagnosis not present

## 2020-07-24 DIAGNOSIS — E1165 Type 2 diabetes mellitus with hyperglycemia: Secondary | ICD-10-CM | POA: Diagnosis not present

## 2020-07-24 DIAGNOSIS — I10 Essential (primary) hypertension: Secondary | ICD-10-CM | POA: Diagnosis not present

## 2020-07-24 DIAGNOSIS — M109 Gout, unspecified: Secondary | ICD-10-CM | POA: Diagnosis not present

## 2020-07-24 DIAGNOSIS — E7849 Other hyperlipidemia: Secondary | ICD-10-CM | POA: Diagnosis not present

## 2020-07-24 DIAGNOSIS — K219 Gastro-esophageal reflux disease without esophagitis: Secondary | ICD-10-CM | POA: Diagnosis not present

## 2020-07-24 DIAGNOSIS — E782 Mixed hyperlipidemia: Secondary | ICD-10-CM | POA: Diagnosis not present

## 2020-07-27 DIAGNOSIS — I10 Essential (primary) hypertension: Secondary | ICD-10-CM | POA: Diagnosis not present

## 2020-07-27 DIAGNOSIS — R1310 Dysphagia, unspecified: Secondary | ICD-10-CM | POA: Diagnosis not present

## 2020-07-27 DIAGNOSIS — R1012 Left upper quadrant pain: Secondary | ICD-10-CM | POA: Diagnosis not present

## 2020-07-27 DIAGNOSIS — E7849 Other hyperlipidemia: Secondary | ICD-10-CM | POA: Diagnosis not present

## 2020-07-27 DIAGNOSIS — D72829 Elevated white blood cell count, unspecified: Secondary | ICD-10-CM | POA: Diagnosis not present

## 2020-07-27 DIAGNOSIS — M109 Gout, unspecified: Secondary | ICD-10-CM | POA: Diagnosis not present

## 2020-07-27 DIAGNOSIS — Z0001 Encounter for general adult medical examination with abnormal findings: Secondary | ICD-10-CM | POA: Diagnosis not present

## 2020-07-27 DIAGNOSIS — E1165 Type 2 diabetes mellitus with hyperglycemia: Secondary | ICD-10-CM | POA: Diagnosis not present

## 2020-08-05 ENCOUNTER — Ambulatory Visit: Payer: Medicare PPO | Admitting: Gastroenterology

## 2020-08-05 ENCOUNTER — Encounter: Payer: Self-pay | Admitting: *Deleted

## 2020-08-05 ENCOUNTER — Other Ambulatory Visit: Payer: Self-pay

## 2020-08-05 ENCOUNTER — Encounter: Payer: Self-pay | Admitting: Gastroenterology

## 2020-08-05 DIAGNOSIS — R1310 Dysphagia, unspecified: Secondary | ICD-10-CM | POA: Insufficient documentation

## 2020-08-05 NOTE — Progress Notes (Signed)
Primary Care Physician:  [facility] Referring Provider: Tawni Carnes, PA-C Primary Gastroenterologist:  Dr. Gala Foley  Chief Complaint  Patient presents with   Dysphagia    Hx dilation   knot luq    HPI:   Beverly Foley is a 70 y.o. female presenting today at the request of Beverly Carnes, PA-C, due to dysphagia. Remote history of EGD, not in records. Dilation at that time with improvement. Last colonoscopy in 2003 with internal hemorrhoids and some increased friability of colon diffusely. Path not available. Declining colonoscopy at this time. Prefers to do Cologuard with PCP.    Recurrent solid food dysphagia for past 3-4 months. Soft foods easier. Solid foods difficult. Has to drink a lot of water to get it down. LUQ pain intermittently. Mashing it hard will hurt. Laying on it will hurt. States left side bulges more.   Diarrhea occasionally on metformin. GERD in the past but lost weight and resolved. Rare if spicy foods.   US abdomen March 2022: gallbladder absent. Normal liver. Normal spleen.    Colonoscopy 2003: internal hemorrhoids. Some increased friability diffusely. Biopsy. Declining colonoscopy. Prefers to do cologuard.   Past Medical History:  Diagnosis Date   Diabetes mellitus without complication ([place])    HTN (hypertension)    Hypercholesteremia     Past Surgical History:  Procedure Laterality Date   CHOLECYSTECTOMY     COLONOSCOPY  2003   internal hemorrhoids and some increased friability of colon diffusely. Path not available.    Current Outpatient Medications  Medication Sig Dispense Refill   cholecalciferol (VITAMIN D3) 25 MCG (1000 UNIT) tablet Take 1,000 Units by mouth daily.     Dulaglutide (TRULICITY) A999333 0000000 SOPN Inject 0.75 mg into the skin once a week.     empagliflozin (JARDIANCE) 25 MG TABS tablet Take 25 mg by mouth daily.     lisinopril-hydrochlorothiazide (PRINZIDE,ZESTORETIC) 20-25 MG per tablet Take 1 tablet by  mouth daily.     metFORMIN (GLUCOPHAGE) 500 MG tablet Take 500 mg by mouth daily.     Multiple Vitamin (MULTIVITAMIN) tablet Take 1 tablet by mouth daily.     No current facility-administered medications for this visit.    Allergies as of 08/05/2020 - Review Complete 08/05/2020  Allergen Reaction Noted   Augmentin [amoxicillin-pot clavulanate]  05/30/2012   Codeine  05/30/2012   Levaquin [levofloxacin in d5w]  05/30/2012    Family History  Problem Relation Age of Onset   Pancreatic cancer Sister    Colon cancer Neg Hx    Colon polyps Neg Hx     Social History   Socioeconomic History   Marital status: Married    Spouse name: Not on file   Number of children: Not on file   Years of education: Not on file   Highest education level: Not on file  Occupational History   Not on file  Tobacco Use   Smoking status: Former    Types: Cigarettes   Smokeless tobacco: Never  Substance and Sexual Activity   Alcohol use: No   Drug use: No   Sexual activity: Not on file  Other Topics Concern   Not on file  Social History Narrative   Not on file   Social Determinants of Health   Financial Resource Strain: Not on file  Food Insecurity: Not on file  Transportation Foley: Not on file  Physical Activity: Not on file  Stress: Not on file  Social Connections: Not on file  Intimate  Partner Violence: Not on file    Review of Systems: Gen: Denies any fever, chills, fatigue, weight loss, lack of appetite.  CV: Denies chest pain, heart palpitations, peripheral edema, syncope.  Resp: Denies shortness of breath at rest or with exertion. Denies wheezing or cough.  GI: see HPI GU : Denies urinary burning, urinary frequency, urinary hesitancy MS: Denies joint pain, muscle weakness, cramps, or limitation of movement.  Derm: Denies rash, itching, dry skin Psych: Denies depression, anxiety, memory loss, and confusion Heme: Denies bruising, bleeding, and enlarged lymph nodes.  Physical  Exam: BP 111/70   Pulse 78   Temp (!) 97.3 F (36.3 C) (Temporal)   Ht '5\' 2"'$  (1.575 m)   Wt 198 lb 9.6 oz (90.1 kg)   BMI 36.32 kg/m  General:   Alert and oriented. Pleasant and cooperative. Well-nourished and well-developed.  Head:  Normocephalic and atraumatic. Eyes:  Without icterus, sclera clear and conjunctiva pink.  Ears:  Normal auditory acuity. Mouth:  mask in place  Lungs:  Clear to auscultation bilaterally. No wheezes, rales, or rhonchi. No distress.  Heart:  S1, S2 present without murmurs appreciated.  Abdomen:  +BS, soft, TTP LUQ and non-distended. No HSM noted. No guarding or rebound. No masses appreciated. No obvious hernia in LUQ Rectal:  Deferred  Msk:  Symmetrical without gross deformities. Normal posture. Extremities:  Without edema. Neurologic:  Alert and  oriented x4;  grossly normal neurologically. Skin:  Intact without significant lesions or rashes. Psych:  Alert and cooperative. Normal mood and affect.  ASSESSMENT: Beverly Foley is a 70 y.o. female presenting today  at the request of Beverly Carnes, PA-C, due to dysphagia. Remote history of EGD, not in records. Dilation at that time with improvement. Last colonoscopy in 2003 with internal hemorrhoids and some increased friability of colon diffusely. Path not available. Declining colonoscopy at this time.   Solid food dysphagia onset approximately 3-4 months ago. Also noting LUQ pain intermittently that is interestingly worsened with pressing on it or laying on side. Her LUQ does bulge slightly with standing and query fat distribution as culprit. No obvious hernia.   Will pursue EGD with dilation in the near future.    PLAN: Proceed with upper endoscopy/dilation by Dr. Gala Foley in near future: the risks, benefits, and alternatives have been discussed with the patient in detail. The patient states understanding and desires to proceed.   Return in follow-up after procedure  Beverly Needs, PhD,  ANP-BC [facility]

## 2020-08-05 NOTE — Patient Instructions (Signed)
We are arranging an upper endoscopy with dilation by Dr. Gala Romney in the near future.  Please do not take Jardiance or metformin the morning of the procedure.  Further recommendations to follow!  We will see you in follow-up after the procedure!  It was a pleasure to see you today. I want to create trusting relationships with patients to provide genuine, compassionate, and quality care. I value your feedback. If you receive a survey regarding your visit,  I greatly appreciate you taking time to fill this out.   [name], PhD, ANP-BC [facility]

## 2020-08-05 NOTE — H&P (View-Only) (Signed)
Primary Care Physician:  [facility] Referring Provider: Tawni Carnes, PA-C Primary Gastroenterologist:  Dr. Gala Foley  Chief Complaint  Patient presents with   Dysphagia    Hx dilation   knot luq    HPI:   Beverly Foley is a 70 y.o. female presenting today at the request of Beverly Carnes, PA-C, due to dysphagia. Remote history of EGD, not in records. Dilation at that time with improvement. Last colonoscopy in 2003 with internal hemorrhoids and some increased friability of colon diffusely. Path not available. Declining colonoscopy at this time. Prefers to do Cologuard with PCP.    Recurrent solid food dysphagia for past 3-4 months. Soft foods easier. Solid foods difficult. Has to drink a lot of water to get it down. LUQ pain intermittently. Mashing it hard will hurt. Laying on it will hurt. States left side bulges more.   Diarrhea occasionally on metformin. GERD in the past but lost weight and resolved. Rare if spicy foods.   US abdomen March 2022: gallbladder absent. Normal liver. Normal spleen.    Colonoscopy 2003: internal hemorrhoids. Some increased friability diffusely. Biopsy. Declining colonoscopy. Prefers to do cologuard.   Past Medical History:  Diagnosis Date   Diabetes mellitus without complication ([place])    HTN (hypertension)    Hypercholesteremia     Past Surgical History:  Procedure Laterality Date   CHOLECYSTECTOMY     COLONOSCOPY  2003   internal hemorrhoids and some increased friability of colon diffusely. Path not available.    Current Outpatient Medications  Medication Sig Dispense Refill   cholecalciferol (VITAMIN D3) 25 MCG (1000 UNIT) tablet Take 1,000 Units by mouth daily.     Dulaglutide (TRULICITY) A999333 0000000 SOPN Inject 0.75 mg into the skin once a week.     empagliflozin (JARDIANCE) 25 MG TABS tablet Take 25 mg by mouth daily.     lisinopril-hydrochlorothiazide (PRINZIDE,ZESTORETIC) 20-25 MG per tablet Take 1 tablet by  mouth daily.     metFORMIN (GLUCOPHAGE) 500 MG tablet Take 500 mg by mouth daily.     Multiple Vitamin (MULTIVITAMIN) tablet Take 1 tablet by mouth daily.     No current facility-administered medications for this visit.    Allergies as of 08/05/2020 - Review Complete 08/05/2020  Allergen Reaction Noted   Augmentin [amoxicillin-pot clavulanate]  05/30/2012   Codeine  05/30/2012   Levaquin [levofloxacin in d5w]  05/30/2012    Family History  Problem Relation Age of Onset   Pancreatic cancer Sister    Colon cancer Neg Hx    Colon polyps Neg Hx     Social History   Socioeconomic History   Marital status: Married    Spouse name: Not on file   Number of children: Not on file   Years of education: Not on file   Highest education level: Not on file  Occupational History   Not on file  Tobacco Use   Smoking status: Former    Types: Cigarettes   Smokeless tobacco: Never  Substance and Sexual Activity   Alcohol use: No   Drug use: No   Sexual activity: Not on file  Other Topics Concern   Not on file  Social History Narrative   Not on file   Social Determinants of Health   Financial Resource Strain: Not on file  Food Insecurity: Not on file  Transportation Foley: Not on file  Physical Activity: Not on file  Stress: Not on file  Social Connections: Not on file  Intimate  Partner Violence: Not on file    Review of Systems: Gen: Denies any fever, chills, fatigue, weight loss, lack of appetite.  CV: Denies chest pain, heart palpitations, peripheral edema, syncope.  Resp: Denies shortness of breath at rest or with exertion. Denies wheezing or cough.  GI: see HPI GU : Denies urinary burning, urinary frequency, urinary hesitancy MS: Denies joint pain, muscle weakness, cramps, or limitation of movement.  Derm: Denies rash, itching, dry skin Psych: Denies depression, anxiety, memory loss, and confusion Heme: Denies bruising, bleeding, and enlarged lymph nodes.  Physical  Exam: BP 111/70   Pulse 78   Temp (!) 97.3 F (36.3 C) (Temporal)   Ht '5\' 2"'$  (1.575 m)   Wt 198 lb 9.6 oz (90.1 kg)   BMI 36.32 kg/m  General:   Alert and oriented. Pleasant and cooperative. Well-nourished and well-developed.  Head:  Normocephalic and atraumatic. Eyes:  Without icterus, sclera clear and conjunctiva pink.  Ears:  Normal auditory acuity. Mouth:  mask in place  Lungs:  Clear to auscultation bilaterally. No wheezes, rales, or rhonchi. No distress.  Heart:  S1, S2 present without murmurs appreciated.  Abdomen:  +BS, soft, TTP LUQ and non-distended. No HSM noted. No guarding or rebound. No masses appreciated. No obvious hernia in LUQ Rectal:  Deferred  Msk:  Symmetrical without gross deformities. Normal posture. Extremities:  Without edema. Neurologic:  Alert and  oriented x4;  grossly normal neurologically. Skin:  Intact without significant lesions or rashes. Psych:  Alert and cooperative. Normal mood and affect.  ASSESSMENT: Beverly Foley is a 70 y.o. female presenting today  at the request of Beverly Carnes, PA-C, due to dysphagia. Remote history of EGD, not in records. Dilation at that time with improvement. Last colonoscopy in 2003 with internal hemorrhoids and some increased friability of colon diffusely. Path not available. Declining colonoscopy at this time.   Solid food dysphagia onset approximately 3-4 months ago. Also noting LUQ pain intermittently that is interestingly worsened with pressing on it or laying on side. Her LUQ does bulge slightly with standing and query fat distribution as culprit. No obvious hernia.   Will pursue EGD with dilation in the near future.    PLAN: Proceed with upper endoscopy/dilation by Dr. Gala Foley in near future: the risks, benefits, and alternatives have been discussed with the patient in detail. The patient states understanding and desires to proceed.   Return in follow-up after procedure  Beverly Needs, PhD,  ANP-BC [facility]

## 2020-08-07 ENCOUNTER — Encounter: Payer: Self-pay | Admitting: Gastroenterology

## 2020-08-20 ENCOUNTER — Other Ambulatory Visit: Payer: Self-pay

## 2020-08-20 ENCOUNTER — Telehealth: Payer: Self-pay

## 2020-08-20 NOTE — Telephone Encounter (Signed)
Received message from [place] at pre-service center, EGD/DIL needs PA from [place].  PA submitted via [employer] website. [employer]# [id number], valid 08/26/20-09/25/20.  Message sent to [place] to inform her of PA.

## 2020-08-26 ENCOUNTER — Other Ambulatory Visit: Payer: Self-pay

## 2020-08-26 ENCOUNTER — Ambulatory Visit (HOSPITAL_COMMUNITY)
Admission: RE | Admit: 2020-08-26 | Discharge: 2020-08-26 | Disposition: A | Discharge status: Home / Self Care | Payer: Medicare PPO | Attending: Internal Medicine | Admitting: Internal Medicine

## 2020-08-26 ENCOUNTER — Encounter (HOSPITAL_COMMUNITY)
Admission: RE | Disposition: A | Discharge status: Home / Self Care | Payer: Self-pay | Source: Home / Self Care | Attending: Internal Medicine

## 2020-08-26 DIAGNOSIS — Z885 Allergy status to narcotic agent status: Secondary | ICD-10-CM | POA: Insufficient documentation

## 2020-08-26 DIAGNOSIS — Z7984 Long term (current) use of oral hypoglycemic drugs: Secondary | ICD-10-CM | POA: Diagnosis not present

## 2020-08-26 DIAGNOSIS — R1310 Dysphagia, unspecified: Secondary | ICD-10-CM

## 2020-08-26 DIAGNOSIS — R1012 Left upper quadrant pain: Secondary | ICD-10-CM | POA: Insufficient documentation

## 2020-08-26 DIAGNOSIS — Z79899 Other long term (current) drug therapy: Secondary | ICD-10-CM | POA: Insufficient documentation

## 2020-08-26 DIAGNOSIS — Z880 Allergy status to penicillin: Secondary | ICD-10-CM | POA: Diagnosis not present

## 2020-08-26 DIAGNOSIS — Z87891 Personal history of nicotine dependence: Secondary | ICD-10-CM | POA: Insufficient documentation

## 2020-08-26 DIAGNOSIS — K222 Esophageal obstruction: Secondary | ICD-10-CM | POA: Diagnosis not present

## 2020-08-26 DIAGNOSIS — Z9049 Acquired absence of other specified parts of digestive tract: Secondary | ICD-10-CM | POA: Diagnosis not present

## 2020-08-26 DIAGNOSIS — Z800 Family history of malignant neoplasm of digestive organs: Secondary | ICD-10-CM | POA: Insufficient documentation

## 2020-08-26 DIAGNOSIS — Z881 Allergy status to other antibiotic agents status: Secondary | ICD-10-CM | POA: Insufficient documentation

## 2020-08-26 HISTORY — PX: MALONEY DILATION: SHX5535

## 2020-08-26 HISTORY — PX: ESOPHAGOGASTRODUODENOSCOPY: SHX5428

## 2020-08-26 LAB — GLUCOSE, CAPILLARY: Glucose-Capillary: 138 mg/dL — ABNORMAL HIGH (ref 70–99)

## 2020-08-26 SURGERY — EGD (ESOPHAGOGASTRODUODENOSCOPY)
Anesthesia: Moderate Sedation

## 2020-08-26 MED ORDER — MIDAZOLAM HCL 5 MG/5ML IJ SOLN
INTRAMUSCULAR | Status: AC
  Filled 2020-08-26: qty 10

## 2020-08-26 MED ORDER — ONDANSETRON HCL 4 MG/2ML IJ SOLN
INTRAMUSCULAR | Status: DC | PRN
  Administered 2020-08-26: 4 mg via INTRAVENOUS

## 2020-08-26 MED ORDER — LIDOCAINE VISCOUS HCL 2 % MT SOLN
OROMUCOSAL | Status: AC
  Filled 2020-08-26: qty 15

## 2020-08-26 MED ORDER — MEPERIDINE HCL 50 MG/ML IJ SOLN
INTRAMUSCULAR | Status: AC
  Filled 2020-08-26: qty 1

## 2020-08-26 MED ORDER — STERILE WATER FOR IRRIGATION IR SOLN
Status: DC | PRN
  Administered 2020-08-26: 100 mL

## 2020-08-26 MED ORDER — SODIUM CHLORIDE 0.9 % IV SOLN: INTRAVENOUS | Status: DC

## 2020-08-26 MED ORDER — ONDANSETRON HCL 4 MG/2ML IJ SOLN
INTRAMUSCULAR | Status: AC
  Filled 2020-08-26: qty 2

## 2020-08-26 MED ORDER — MEPERIDINE HCL 100 MG/ML IJ SOLN
INTRAMUSCULAR | Status: DC | PRN
  Administered 2020-08-26: 40 mg via INTRAVENOUS
  Administered 2020-08-26: 10 mg via INTRAVENOUS

## 2020-08-26 MED ORDER — LIDOCAINE VISCOUS HCL 2 % MT SOLN
OROMUCOSAL | Status: DC | PRN
  Administered 2020-08-26: 5 mL via OROMUCOSAL

## 2020-08-26 MED ORDER — MIDAZOLAM HCL 5 MG/5ML IJ SOLN
INTRAMUSCULAR | Status: DC | PRN
  Administered 2020-08-26: 1 mg via INTRAVENOUS
  Administered 2020-08-26: 2 mg via INTRAVENOUS
  Administered 2020-08-26: 1 mg via INTRAVENOUS

## 2020-08-26 NOTE — Interval H&P Note (Signed)
History and Physical Interval Note:  08/26/2020 7:21 AM  Beverly Foley  has presented today for surgery, with the diagnosis of dysphagia.  The various methods of treatment have been discussed with the patient and family. After consideration of risks, benefits and other options for treatment, the patient has consented to  Procedure(s) with comments: ESOPHAGOGASTRODUODENOSCOPY (EGD) (N/A) - 7:30am MALONEY DILATION (N/A) as a surgical intervention.  The patient's history has been reviewed, patient examined, no change in status, stable for surgery.  I have reviewed the patient's chart and labs.  Questions were answered to the patient's satisfaction.     Beverly Foley  No change.  Patient denies any GERD symptoms.  Not on GERD treatment.  EGD with possible esophageal dilation today per plan.  The risks, benefits, limitations, alternatives and imponderables have been reviewed with the patient. Potential for esophageal dilation, biopsy, etc. have also been reviewed.  Questions have been answered. All parties agreeable.

## 2020-08-26 NOTE — Discharge Instructions (Signed)
EGD Discharge instructions Please read the instructions outlined below and refer to this sheet in the next few weeks. These discharge instructions provide you with general information on caring for yourself after you leave the hospital. Your doctor may also give you specific instructions. While your treatment has been planned according to the most current medical practices available, unavoidable complications occasionally occur. If you have any problems or questions after discharge, please call your doctor. ACTIVITY You may resume your regular activity but move at a slower pace for the next 24 hours.  Take frequent rest periods for the next 24 hours.  Walking will help expel (get rid of) the air and reduce the bloated feeling in your abdomen.  No driving for 24 hours (because of the anesthesia (medicine) used during the test).  You may shower.  Do not sign any important legal documents or operate any machinery for 24 hours (because of the anesthesia used during the test).  NUTRITION Drink plenty of fluids.  You may resume your normal diet.  Begin with a light meal and progress to your normal diet.  Avoid alcoholic beverages for 24 hours or as instructed by your caregiver.  MEDICATIONS You may resume your normal medications unless your caregiver tells you otherwise.  WHAT YOU CAN EXPECT TODAY You may experience abdominal discomfort such as a feeling of fullness or "gas" pains.  FOLLOW-UP Your doctor will discuss the results of your test with you.  SEEK IMMEDIATE MEDICAL ATTENTION IF ANY OF THE FOLLOWING OCCUR: Excessive nausea (feeling sick to your stomach) and/or vomiting.  Severe abdominal pain and distention (swelling).  Trouble swallowing.  Temperature over 101 F (37.8 C).  Rectal bleeding or vomiting of blood.     Your esophagus was narrowed.  It was dilated today.  I recommend you be treated for acid reflux.  We will begin Protonix 40 mg once daily.  Prescription  provided.  Office visit with [place] in 3 months  At patient request, I called Dennis at [phone]

## 2020-08-26 NOTE — Op Note (Signed)
[facility] Patient Name: Beverly Foley Procedure Date: 08/26/2020 7:14 AM MRN: [id number] Date of Birth: 06-Dec-1950 Attending MD: Norvel Richards , MD CSN: [id number] Age: 70 Admit Type: Outpatient Procedure:                Upper GI endoscopy Indications:              Dysphagia Providers:                Norvel Richards, MD Referring MD:              Medicines:                Midazolam 4 mg IV, Meperidine 50 mg IV Complications:            No immediate complications. Estimated Blood Loss:     Estimated blood loss was minimal. Procedure:                Pre-Anesthesia Assessment:                           - Prior to the procedure, a History and Physical                            was performed, and patient medications and                            allergies were reviewed. The patient's tolerance of                            previous anesthesia was also reviewed. The risks                            and benefits of the procedure and the sedation                            options and risks were discussed with the patient.                            All questions were answered, and informed consent                            was obtained. Prior Anticoagulants: The patient has                            taken no previous anticoagulant or antiplatelet                            agents. ASA Grade Assessment: II - A patient with                            mild systemic disease. After reviewing the risks                            and benefits, the patient was deemed in  satisfactory condition to undergo the procedure.                           After obtaining informed consent, the endoscope was                            passed under direct vision. Throughout the                            procedure, the patient's blood pressure, pulse, and                            oxygen saturations were monitored continuously. The                             [id number] scope was introduced through the                            mouth, and advanced to the second part of duodenum.                            The upper GI endoscopy was accomplished without                            difficulty. The patient tolerated the procedure                            well. Scope In: [id number] AM Scope Out: 7:53:22 AM Total Procedure Duration: 0 hours 6 minutes 37 seconds  Findings:      A mild Schatzki ring was found at the gastroesophageal junction.       Esophagus otherwise appeared normal.      The entire examined stomach was normal.      The duodenal bulb and second portion of the duodenum were normal. The       scope was withdrawn. Dilation was performed with a Maloney dilator with       mild resistance at 46 Fr. The dilation site was examined following       endoscope reinsertion and showed moderate improvement in luminal       narrowing. Estimated blood loss was minimal Impression:               - Mild Schatzki ring. Dilated.                           - Normal stomach.                           - Normal duodenal bulb and second portion of the                            duodenum.                           - No specimens collected. Moderate Sedation:      Moderate (conscious) sedation was administered by the endoscopy nurse  and supervised by the endoscopist. The following parameters were       monitored: oxygen saturation, heart rate, blood pressure, respiratory       rate, EKG, adequacy of pulmonary ventilation, and response to care.       Total physician intraservice time was 16 minutes. Recommendation:           - Patient has a contact number available for                            emergencies. The signs and symptoms of potential                            delayed complications were discussed with the                            patient. Return to normal activities tomorrow.                            Written discharge instructions  were provided to the                            patient.                           - Resume previous diet.                           - Continue present medications. Begin Protonix 40                            mg once daily.                           - Return to my office in 3 months. Procedure Code(s):        --- Professional ---                           [phone], Esophagogastroduodenoscopy, flexible,                            transoral; diagnostic, including collection of                            specimen(s) by brushing or washing, when performed                            (separate procedure)                           43450, Dilation of esophagus, by unguided sound or                            bougie, single or multiple passes                           G0500, Moderate sedation services provided by the  same physician or other qualified health care                            professional performing a gastrointestinal                            endoscopic service that sedation supports,                            requiring the presence of an independent trained                            observer to assist in the monitoring of the                            patient's level of consciousness and physiological                            status; initial 15 minutes of intra-service time;                            patient age 23 years or older (additional time may                            be reported with [phone], as appropriate) Diagnosis Code(s):        --- Professional ---                           K22.2, Esophageal obstruction                           R13.10, Dysphagia, unspecified CPT copyright 2019 American Medical Association. All rights reserved. The codes documented in this report are preliminary and upon coder review may  be revised to meet current compliance requirements. Cristopher Estimable. Amilee Janvier, MD Norvel Richards, MD 08/26/2020 8:03:35 AM This report  has been signed electronically. Number of Addenda: 0

## 2020-09-04 ENCOUNTER — Encounter (HOSPITAL_COMMUNITY): Payer: Self-pay | Admitting: Internal Medicine

## 2020-11-10 DIAGNOSIS — Z1231 Encounter for screening mammogram for malignant neoplasm of breast: Secondary | ICD-10-CM | POA: Diagnosis not present

## 2020-11-20 DIAGNOSIS — I10 Essential (primary) hypertension: Secondary | ICD-10-CM | POA: Diagnosis not present

## 2020-11-20 DIAGNOSIS — E7849 Other hyperlipidemia: Secondary | ICD-10-CM | POA: Diagnosis not present

## 2020-11-20 DIAGNOSIS — E1165 Type 2 diabetes mellitus with hyperglycemia: Secondary | ICD-10-CM | POA: Diagnosis not present

## 2020-11-20 DIAGNOSIS — K219 Gastro-esophageal reflux disease without esophagitis: Secondary | ICD-10-CM | POA: Diagnosis not present

## 2020-11-20 DIAGNOSIS — M109 Gout, unspecified: Secondary | ICD-10-CM | POA: Diagnosis not present

## 2020-11-20 DIAGNOSIS — E782 Mixed hyperlipidemia: Secondary | ICD-10-CM | POA: Diagnosis not present

## 2020-11-25 DIAGNOSIS — E1165 Type 2 diabetes mellitus with hyperglycemia: Secondary | ICD-10-CM | POA: Diagnosis not present

## 2020-11-25 DIAGNOSIS — E041 Nontoxic single thyroid nodule: Secondary | ICD-10-CM | POA: Diagnosis not present

## 2020-11-25 DIAGNOSIS — K219 Gastro-esophageal reflux disease without esophagitis: Secondary | ICD-10-CM | POA: Diagnosis not present

## 2020-11-25 DIAGNOSIS — E7849 Other hyperlipidemia: Secondary | ICD-10-CM | POA: Diagnosis not present

## 2020-11-25 DIAGNOSIS — M109 Gout, unspecified: Secondary | ICD-10-CM | POA: Diagnosis not present

## 2020-11-25 DIAGNOSIS — I10 Essential (primary) hypertension: Secondary | ICD-10-CM | POA: Diagnosis not present

## 2020-11-25 DIAGNOSIS — Z6837 Body mass index (BMI) 37.0-37.9, adult: Secondary | ICD-10-CM | POA: Diagnosis not present

## 2020-12-16 ENCOUNTER — Ambulatory Visit: Payer: Medicare PPO | Admitting: Gastroenterology

## 2021-01-20 DIAGNOSIS — Z79899 Other long term (current) drug therapy: Secondary | ICD-10-CM | POA: Diagnosis not present

## 2021-02-04 DIAGNOSIS — Z780 Asymptomatic menopausal state: Secondary | ICD-10-CM | POA: Diagnosis not present

## 2021-02-04 DIAGNOSIS — M810 Age-related osteoporosis without current pathological fracture: Secondary | ICD-10-CM | POA: Diagnosis not present

## 2021-03-18 ENCOUNTER — Encounter: Payer: Self-pay | Admitting: Gastroenterology

## 2021-03-18 NOTE — Progress Notes (Signed)
? ? ? ? ?Primary Care Physician: [facility] ? ?Primary Gastroenterologist:  Garfield Cornea, MD ? ? ?Chief Complaint  ?Patient presents with  ? Follow-up  ?  Follow up from having esophagus stretched. Patient states she is doing better and not choking any more. No concerns today.   ? ? ?HPI: Beverly Foley is a 71 y.o. female here for follow-up of dysphagia.  Last seen in the office in July.  She had an EGD with dilation in August 2022 for esophageal dysphagia.  She had a mild Schatzki ring which was dilated. ? ?Swallowing is much better.  Only has problems if she swallowing dry chicken.  Only has occasional heartburn symptoms with certain foods such as tomato-based.  She took pantoprazole for a while but stopped it because she just does not like to take more pills and did not feel like she needed it.  Denies any nausea/vomiting, abdominal pain.  Bowel movements are regular.  No blood in the stool or melena.  She continues to be concerned about slight bulge in the left upper quadrant which is sometimes painful.  No specific abnormality noted on abdominal ultrasound.  Patient states Dr. Gala Romney examined her day of her procedure and stated it may be a lipoma but did not seem to be alarmed. ? ?Weight is down 12 pounds in the past six months.  ? ?EGD August 2022: ?- mild Schatzki ring. Dilated. ?- Normal stomach. ?- Normal duodenal bulb and second portion of the duodenum. ?- No specimens collected ? ?Last colonoscopy in 2003 with internal hemorrhoids and some increased friability of the colon diffusely.  Pathology not available.  Declined colonoscopy at time of last office visit, preferring to do Cologuard with PCP. States she completed cologuard in 10/2020 and was negative.  ? ?Current Outpatient Medications  ?Medication Sig Dispense Refill  ? Calcium Carbonate (CALCIUM 600 PO) Take by mouth. One daily    ? Cholecalciferol (VITAMIN D) 50 MCG (2000 UT) CAPS Take 2,000 Units by mouth.    ? Dulaglutide  (TRULICITY) 8.41 YS/0.6TK SOPN Inject 0.75 mg into the skin every Wednesday.    ? empagliflozin (JARDIANCE) 25 MG TABS tablet Take 25 mg by mouth in the morning.    ? lisinopril-hydrochlorothiazide (PRINZIDE,ZESTORETIC) 20-25 MG per tablet Take 1 tablet by mouth in the morning.    ? metFORMIN (GLUCOPHAGE) 500 MG tablet Take 500 mg by mouth in the morning.    ? Multiple Vitamin (MULTIVITAMIN WITH MINERALS) TABS tablet Take 1 tablet by mouth daily. One-A-Day Multivitamin    ? pantoprazole (PROTONIX) 40 MG tablet Take 40 mg by mouth daily.    ? ?No current facility-administered medications for this visit.  ? ? ?Allergies as of 03/19/2021 - Review Complete 03/19/2021  ?Allergen Reaction Noted  ? Levaquin [levofloxacin in d5w] Nausea And Vomiting and Rash 05/30/2012  ? Codeine Nausea And Vomiting 05/30/2012  ? Augmentin [amoxicillin-pot clavulanate] Nausea And Vomiting and Rash 05/30/2012  ? ? ?ROS: ? ?General: Negative for anorexia, weight loss, fever, chills, fatigue, weakness. ?ENT: Negative for hoarseness, difficulty swallowing , nasal congestion. ?CV: Negative for chest pain, angina, palpitations, dyspnea on exertion, peripheral edema.  ?Respiratory: Negative for dyspnea at rest, dyspnea on exertion, cough, sputum, wheezing.  ?GI: See history of present illness. ?GU:  Negative for dysuria, hematuria, urinary incontinence, urinary frequency, nocturnal urination.  ?Endo: Negative for unusual weight change.  ?  ?Physical Examination: ? ? BP 110/64 (BP Location: Right Arm, Patient Position: Sitting, Cuff Size: Large)  Pulse 87   Temp (!) 97.1 ?F (36.2 ?C) (Temporal)   Ht '5\' 1"'$  (1.549 m)   Wt 204 lb 12.8 oz (92.9 kg)   BMI 38.70 kg/m?  ? ?General: Well-nourished, well-developed in no acute distress.  ?Eyes: No icterus. ?Mouth: masked ?Abdomen: Bowel sounds are normal, nontender, nondistended, lemon sized area in luq fatty-like but not completely uniform like a lipoma, no hernia.  ?Extremities: No lower extremity  edema. No clubbing or deformities. ?Neuro: Alert and oriented x 4   ?Skin: Warm and dry, no jaundice.   ?Psych: Alert and cooperative, normal mood and affect. ?  ? ?Assessment: ? ?Schatzki ring: Status post esophageal dilation as outlined.  Only occasional typical heartburn to trigger foods.  May have silent reflux.  Discussed reasoning behind taking pantoprazole to try to prevent recurrent esophageal ring which may be linked to reflux.  Other option of not taking medication, trying to control with diet but if she has recurrent needs for esophageal dilation in the future, then would highly recommend taking regular PPI.  Patient is willing to take PPI 3-4 times per week for now, to try to prevent need for further endoscopy.  Reinforced antireflux measures. ? ?Colon cancer screening: Patient states that Cologuard was negative in October 2022.  Based on that information she would need another Cologuard in October 2025 and after that could consider no further colon cancer screening. ? ?Left upper quadrant mass: Fatty like mass, chronic and stable.  Occasional discomfort likely due to location underneath bra.  Abdominal ultrasound findings as outlined previously with no explanation for her discomfort in that region.  Could consider a limited ultrasound specifically of this fatty like mass to confirm if it is a lipoma.  Also could consider sending to general surgery to see if would benefit from removal.  Patient currently not interested, plans to continue to monitor and will let us know if she changes her mind. ? ?Plan: ?Continue pantoprazole 40 mg 3-4 times per week. ?Reinforced antireflux measures. ?Return to the office in 2 years. ?

## 2021-03-19 ENCOUNTER — Ambulatory Visit: Payer: Medicare PPO | Admitting: Gastroenterology

## 2021-03-19 ENCOUNTER — Encounter: Payer: Self-pay | Admitting: Gastroenterology

## 2021-03-19 ENCOUNTER — Other Ambulatory Visit: Payer: Self-pay

## 2021-03-19 VITALS — BP 110/64 | HR 87 | Temp 97.1°F | Ht 61.0 in | Wt 204.8 lb

## 2021-03-19 DIAGNOSIS — R1319 Other dysphagia: Secondary | ICD-10-CM

## 2021-03-19 NOTE — Patient Instructions (Signed)
Continue pantoprazole '40mg'$  at least 3-4 days per week to treat silent reflux and try to prevent recurrent esophageal ring.  ?Return to the office in 2 years.  ? ?Food Choices for Gastroesophageal Reflux Disease, Adult ?When you have gastroesophageal reflux disease (GERD), the foods you eat and your eating habits are very important. Choosing the right foods can help ease the discomfort of GERD. Consider working with a dietitian to help you make healthy food choices. ?What are tips for following this plan? ?Reading food labels ?Look for foods that are low in saturated fat. Foods that have less than 5% of daily value (DV) of fat and 0 g of trans fats may help with your symptoms. ?Cooking ?Cook foods using methods other than frying. This may include baking, steaming, grilling, or broiling. These are all methods that do not need a lot of fat for cooking. ?To add flavor, try to use herbs that are low in spice and acidity. ?Meal planning ? ?Choose healthy foods that are low in fat, such as fruits, vegetables, whole grains, low-fat dairy products, lean meats, fish, and poultry. ?Eat frequent, small meals instead of three large meals each day. Eat your meals slowly, in a relaxed setting. Avoid bending over or lying down until 2-3 hours after eating. ?Limit high-fat foods such as fatty meats or fried foods. ?Limit your intake of fatty foods, such as oils, butter, and shortening. ?Avoid the following as told by your health care provider: ?Foods that cause symptoms. These may be different for different people. Keep a food diary to keep track of foods that cause symptoms. ?Alcohol. ?Drinking large amounts of liquid with meals. ?Eating meals during the 2-3 hours before bed. ?Lifestyle ?Maintain a healthy weight. Ask your health care provider what weight is healthy for you. If you need to lose weight, work with your health care provider to do so safely. ?Exercise for at least 30 minutes on 5 or more days each week, or as told by  your health care provider. ?Avoid wearing clothes that fit tightly around your waist and chest. ?Do not use any products that contain nicotine or tobacco. These products include cigarettes, chewing tobacco, and vaping devices, such as e-cigarettes. If you need help quitting, ask your health care provider. ?Sleep with the head of your bed raised. Use a wedge under the mattress or blocks under the bed frame to raise the head of the bed. ?Chew sugar-free gum after mealtimes. ?What foods should I eat? ?Eat a healthy, well-balanced diet of fruits, vegetables, whole grains, low-fat dairy products, lean meats, fish, and poultry. Each person is different. Foods that may trigger symptoms in one person may not trigger any symptoms in another person. Work with your health care provider to identify foods that are safe for you. ?The items listed above may not be a complete list of recommended foods and beverages. Contact a dietitian for more information. ?What foods should I avoid? ?Limiting some of these foods may help manage the symptoms of GERD. Everyone is different. Consult a dietitian or your health care provider to help you identify the exact foods to avoid, if any. ?Fruits ?Any fruits prepared with added fat. Any fruits that cause symptoms. For some people this may include citrus fruits, such as oranges, grapefruit, pineapple, and lemons. ?Vegetables ?Deep-fried vegetables. [place] fries. Any vegetables prepared with added fat. Any vegetables that cause symptoms. For some people, this may include tomatoes and tomato products, chili peppers, onions and garlic, and horseradish. ?Grains ?Pastries or quick  breads with added fat. ?Meats and other proteins ?High-fat meats, such as fatty beef or pork, hot dogs, ribs, ham, sausage, salami, and bacon. Fried meat or protein, including fried fish and fried chicken. Nuts and nut butters, in large amounts. ?Dairy ?Whole milk and chocolate milk. Sour cream. Cream. Ice cream. Cream  cheese. Milkshakes. ?Fats and oils ?Butter. Margarine. Shortening. Ghee. ?Beverages ?Coffee and tea, with or without caffeine. Carbonated beverages. Sodas. Energy drinks. Fruit juice made with acidic fruits, such as orange or grapefruit. Tomato juice. Alcoholic drinks. ?Sweets and desserts ?Chocolate and cocoa. Donuts. ?Seasonings and condiments ?Pepper. Peppermint and spearmint. Added salt. Any condiments, herbs, or seasonings that cause symptoms. For some people, this may include curry, hot sauce, or vinegar-based salad dressings. ?The items listed above may not be a complete list of foods and beverages to avoid. Contact a dietitian for more information. ?Questions to ask your health care provider ?Diet and lifestyle changes are usually the first steps that are taken to manage symptoms of GERD. If diet and lifestyle changes do not improve your symptoms, talk with your health care provider about taking medicines. ?Where to find more information ?International Foundation for Gastrointestinal Disorders: aboutgerd.org ?Summary ?When you have gastroesophageal reflux disease (GERD), food and lifestyle choices may be very helpful in easing the discomfort of GERD. ?Eat frequent, small meals instead of three large meals each day. Eat your meals slowly, in a relaxed setting. Avoid bending over or lying down until 2-3 hours after eating. ?Limit high-fat foods such as fatty meats or fried foods. ?This information is not intended to replace advice given to you by your health care provider. Make sure you discuss any questions you have with your health care provider. ?Document Revised: 07/08/2019 Document Reviewed: 07/08/2019 ?Elsevier Patient Education ? [place]. ? ?

## 2021-03-23 DIAGNOSIS — E1165 Type 2 diabetes mellitus with hyperglycemia: Secondary | ICD-10-CM | POA: Diagnosis not present

## 2021-03-23 DIAGNOSIS — E7849 Other hyperlipidemia: Secondary | ICD-10-CM | POA: Diagnosis not present

## 2021-03-23 DIAGNOSIS — M109 Gout, unspecified: Secondary | ICD-10-CM | POA: Diagnosis not present

## 2021-03-23 DIAGNOSIS — I10 Essential (primary) hypertension: Secondary | ICD-10-CM | POA: Diagnosis not present

## 2021-03-23 DIAGNOSIS — K219 Gastro-esophageal reflux disease without esophagitis: Secondary | ICD-10-CM | POA: Diagnosis not present

## 2021-03-23 DIAGNOSIS — E782 Mixed hyperlipidemia: Secondary | ICD-10-CM | POA: Diagnosis not present

## 2021-03-30 DIAGNOSIS — E1165 Type 2 diabetes mellitus with hyperglycemia: Secondary | ICD-10-CM | POA: Diagnosis not present

## 2021-03-30 DIAGNOSIS — E7849 Other hyperlipidemia: Secondary | ICD-10-CM | POA: Diagnosis not present

## 2021-03-30 DIAGNOSIS — E041 Nontoxic single thyroid nodule: Secondary | ICD-10-CM | POA: Diagnosis not present

## 2021-03-30 DIAGNOSIS — M109 Gout, unspecified: Secondary | ICD-10-CM | POA: Diagnosis not present

## 2021-03-30 DIAGNOSIS — Z6838 Body mass index (BMI) 38.0-38.9, adult: Secondary | ICD-10-CM | POA: Diagnosis not present

## 2021-03-30 DIAGNOSIS — I10 Essential (primary) hypertension: Secondary | ICD-10-CM | POA: Diagnosis not present

## 2021-03-30 DIAGNOSIS — K219 Gastro-esophageal reflux disease without esophagitis: Secondary | ICD-10-CM | POA: Diagnosis not present

## 2021-05-04 ENCOUNTER — Other Ambulatory Visit: Payer: Self-pay | Admitting: Internal Medicine

## 2021-05-04 DIAGNOSIS — Z6839 Body mass index (BMI) 39.0-39.9, adult: Secondary | ICD-10-CM | POA: Diagnosis not present

## 2021-05-04 DIAGNOSIS — R609 Edema, unspecified: Secondary | ICD-10-CM | POA: Diagnosis not present

## 2021-05-04 DIAGNOSIS — I10 Essential (primary) hypertension: Secondary | ICD-10-CM | POA: Diagnosis not present

## 2021-05-04 DIAGNOSIS — E1165 Type 2 diabetes mellitus with hyperglycemia: Secondary | ICD-10-CM | POA: Diagnosis not present

## 2021-05-04 NOTE — Telephone Encounter (Signed)
Last ov 03/19/21 ?

## 2021-07-20 DIAGNOSIS — L0291 Cutaneous abscess, unspecified: Secondary | ICD-10-CM | POA: Diagnosis not present

## 2021-07-20 DIAGNOSIS — L304 Erythema intertrigo: Secondary | ICD-10-CM | POA: Diagnosis not present

## 2021-07-20 DIAGNOSIS — Z6837 Body mass index (BMI) 37.0-37.9, adult: Secondary | ICD-10-CM | POA: Diagnosis not present

## 2021-07-20 DIAGNOSIS — R030 Elevated blood-pressure reading, without diagnosis of hypertension: Secondary | ICD-10-CM | POA: Diagnosis not present

## 2021-09-17 DIAGNOSIS — E1165 Type 2 diabetes mellitus with hyperglycemia: Secondary | ICD-10-CM | POA: Diagnosis not present

## 2021-09-17 DIAGNOSIS — E041 Nontoxic single thyroid nodule: Secondary | ICD-10-CM | POA: Diagnosis not present

## 2021-09-17 DIAGNOSIS — M109 Gout, unspecified: Secondary | ICD-10-CM | POA: Diagnosis not present

## 2021-09-17 DIAGNOSIS — D72829 Elevated white blood cell count, unspecified: Secondary | ICD-10-CM | POA: Diagnosis not present

## 2021-09-17 DIAGNOSIS — E782 Mixed hyperlipidemia: Secondary | ICD-10-CM | POA: Diagnosis not present

## 2021-09-17 DIAGNOSIS — K219 Gastro-esophageal reflux disease without esophagitis: Secondary | ICD-10-CM | POA: Diagnosis not present

## 2021-09-17 DIAGNOSIS — I10 Essential (primary) hypertension: Secondary | ICD-10-CM | POA: Diagnosis not present

## 2021-09-17 DIAGNOSIS — E7849 Other hyperlipidemia: Secondary | ICD-10-CM | POA: Diagnosis not present

## 2021-09-30 DIAGNOSIS — Z6838 Body mass index (BMI) 38.0-38.9, adult: Secondary | ICD-10-CM | POA: Diagnosis not present

## 2021-09-30 DIAGNOSIS — E782 Mixed hyperlipidemia: Secondary | ICD-10-CM | POA: Diagnosis not present

## 2021-09-30 DIAGNOSIS — Z23 Encounter for immunization: Secondary | ICD-10-CM | POA: Diagnosis not present

## 2021-09-30 DIAGNOSIS — K219 Gastro-esophageal reflux disease without esophagitis: Secondary | ICD-10-CM | POA: Diagnosis not present

## 2021-09-30 DIAGNOSIS — E041 Nontoxic single thyroid nodule: Secondary | ICD-10-CM | POA: Diagnosis not present

## 2021-09-30 DIAGNOSIS — E7849 Other hyperlipidemia: Secondary | ICD-10-CM | POA: Diagnosis not present

## 2021-09-30 DIAGNOSIS — Z6837 Body mass index (BMI) 37.0-37.9, adult: Secondary | ICD-10-CM | POA: Diagnosis not present

## 2021-09-30 DIAGNOSIS — I10 Essential (primary) hypertension: Secondary | ICD-10-CM | POA: Diagnosis not present

## 2021-09-30 DIAGNOSIS — M109 Gout, unspecified: Secondary | ICD-10-CM | POA: Diagnosis not present

## 2021-09-30 DIAGNOSIS — E1165 Type 2 diabetes mellitus with hyperglycemia: Secondary | ICD-10-CM | POA: Diagnosis not present

## 2021-12-27 DIAGNOSIS — E782 Mixed hyperlipidemia: Secondary | ICD-10-CM | POA: Diagnosis not present

## 2021-12-27 DIAGNOSIS — D72829 Elevated white blood cell count, unspecified: Secondary | ICD-10-CM | POA: Diagnosis not present

## 2021-12-27 DIAGNOSIS — K219 Gastro-esophageal reflux disease without esophagitis: Secondary | ICD-10-CM | POA: Diagnosis not present

## 2021-12-27 DIAGNOSIS — E1165 Type 2 diabetes mellitus with hyperglycemia: Secondary | ICD-10-CM | POA: Diagnosis not present

## 2021-12-27 DIAGNOSIS — R5383 Other fatigue: Secondary | ICD-10-CM | POA: Diagnosis not present

## 2021-12-27 DIAGNOSIS — E7849 Other hyperlipidemia: Secondary | ICD-10-CM | POA: Diagnosis not present

## 2021-12-27 DIAGNOSIS — M109 Gout, unspecified: Secondary | ICD-10-CM | POA: Diagnosis not present

## 2021-12-29 DIAGNOSIS — E1165 Type 2 diabetes mellitus with hyperglycemia: Secondary | ICD-10-CM | POA: Diagnosis not present

## 2021-12-29 DIAGNOSIS — E7849 Other hyperlipidemia: Secondary | ICD-10-CM | POA: Diagnosis not present

## 2021-12-29 DIAGNOSIS — M109 Gout, unspecified: Secondary | ICD-10-CM | POA: Diagnosis not present

## 2021-12-29 DIAGNOSIS — E041 Nontoxic single thyroid nodule: Secondary | ICD-10-CM | POA: Diagnosis not present

## 2021-12-29 DIAGNOSIS — Z6837 Body mass index (BMI) 37.0-37.9, adult: Secondary | ICD-10-CM | POA: Diagnosis not present

## 2021-12-29 DIAGNOSIS — I10 Essential (primary) hypertension: Secondary | ICD-10-CM | POA: Diagnosis not present

## 2021-12-29 DIAGNOSIS — K219 Gastro-esophageal reflux disease without esophagitis: Secondary | ICD-10-CM | POA: Diagnosis not present

## 2021-12-29 DIAGNOSIS — Z789 Other specified health status: Secondary | ICD-10-CM | POA: Diagnosis not present

## 2022-05-05 DIAGNOSIS — H43392 Other vitreous opacities, left eye: Secondary | ICD-10-CM | POA: Diagnosis not present

## 2022-06-24 DIAGNOSIS — E1165 Type 2 diabetes mellitus with hyperglycemia: Secondary | ICD-10-CM | POA: Diagnosis not present

## 2022-06-24 DIAGNOSIS — I10 Essential (primary) hypertension: Secondary | ICD-10-CM | POA: Diagnosis not present

## 2022-06-24 DIAGNOSIS — K219 Gastro-esophageal reflux disease without esophagitis: Secondary | ICD-10-CM | POA: Diagnosis not present

## 2022-06-24 DIAGNOSIS — E782 Mixed hyperlipidemia: Secondary | ICD-10-CM | POA: Diagnosis not present

## 2022-06-24 DIAGNOSIS — E7849 Other hyperlipidemia: Secondary | ICD-10-CM | POA: Diagnosis not present

## 2022-06-24 DIAGNOSIS — E041 Nontoxic single thyroid nodule: Secondary | ICD-10-CM | POA: Diagnosis not present

## 2022-07-01 DIAGNOSIS — M109 Gout, unspecified: Secondary | ICD-10-CM | POA: Diagnosis not present

## 2022-07-01 DIAGNOSIS — E7849 Other hyperlipidemia: Secondary | ICD-10-CM | POA: Diagnosis not present

## 2022-07-01 DIAGNOSIS — Z789 Other specified health status: Secondary | ICD-10-CM | POA: Diagnosis not present

## 2022-07-01 DIAGNOSIS — E1165 Type 2 diabetes mellitus with hyperglycemia: Secondary | ICD-10-CM | POA: Diagnosis not present

## 2022-07-01 DIAGNOSIS — E041 Nontoxic single thyroid nodule: Secondary | ICD-10-CM | POA: Diagnosis not present

## 2022-07-01 DIAGNOSIS — K219 Gastro-esophageal reflux disease without esophagitis: Secondary | ICD-10-CM | POA: Diagnosis not present

## 2022-07-01 DIAGNOSIS — I10 Essential (primary) hypertension: Secondary | ICD-10-CM | POA: Diagnosis not present

## 2022-07-01 DIAGNOSIS — Z6837 Body mass index (BMI) 37.0-37.9, adult: Secondary | ICD-10-CM | POA: Diagnosis not present

## 2022-07-01 DIAGNOSIS — R030 Elevated blood-pressure reading, without diagnosis of hypertension: Secondary | ICD-10-CM | POA: Diagnosis not present

## 2022-09-21 DIAGNOSIS — Z6835 Body mass index (BMI) 35.0-35.9, adult: Secondary | ICD-10-CM | POA: Diagnosis not present

## 2022-09-21 DIAGNOSIS — L0390 Cellulitis, unspecified: Secondary | ICD-10-CM | POA: Diagnosis not present

## 2022-09-21 DIAGNOSIS — R030 Elevated blood-pressure reading, without diagnosis of hypertension: Secondary | ICD-10-CM | POA: Diagnosis not present

## 2022-10-06 DIAGNOSIS — E7849 Other hyperlipidemia: Secondary | ICD-10-CM | POA: Diagnosis not present

## 2022-10-06 DIAGNOSIS — M109 Gout, unspecified: Secondary | ICD-10-CM | POA: Diagnosis not present

## 2022-10-06 DIAGNOSIS — E1165 Type 2 diabetes mellitus with hyperglycemia: Secondary | ICD-10-CM | POA: Diagnosis not present

## 2022-10-06 DIAGNOSIS — K219 Gastro-esophageal reflux disease without esophagitis: Secondary | ICD-10-CM | POA: Diagnosis not present

## 2022-10-13 DIAGNOSIS — E7849 Other hyperlipidemia: Secondary | ICD-10-CM | POA: Diagnosis not present

## 2022-10-13 DIAGNOSIS — K219 Gastro-esophageal reflux disease without esophagitis: Secondary | ICD-10-CM | POA: Diagnosis not present

## 2022-10-13 DIAGNOSIS — E1165 Type 2 diabetes mellitus with hyperglycemia: Secondary | ICD-10-CM | POA: Diagnosis not present

## 2022-10-13 DIAGNOSIS — E041 Nontoxic single thyroid nodule: Secondary | ICD-10-CM | POA: Diagnosis not present

## 2022-10-13 DIAGNOSIS — Z0001 Encounter for general adult medical examination with abnormal findings: Secondary | ICD-10-CM | POA: Diagnosis not present

## 2022-10-13 DIAGNOSIS — Z6837 Body mass index (BMI) 37.0-37.9, adult: Secondary | ICD-10-CM | POA: Diagnosis not present

## 2022-10-13 DIAGNOSIS — Z789 Other specified health status: Secondary | ICD-10-CM | POA: Diagnosis not present

## 2022-10-13 DIAGNOSIS — M109 Gout, unspecified: Secondary | ICD-10-CM | POA: Diagnosis not present

## 2022-10-13 DIAGNOSIS — I10 Essential (primary) hypertension: Secondary | ICD-10-CM | POA: Diagnosis not present

## 2022-11-04 DIAGNOSIS — Z1231 Encounter for screening mammogram for malignant neoplasm of breast: Secondary | ICD-10-CM | POA: Diagnosis not present

## 2023-01-09 DIAGNOSIS — E782 Mixed hyperlipidemia: Secondary | ICD-10-CM | POA: Diagnosis not present

## 2023-01-09 DIAGNOSIS — E1165 Type 2 diabetes mellitus with hyperglycemia: Secondary | ICD-10-CM | POA: Diagnosis not present

## 2023-01-09 DIAGNOSIS — D72829 Elevated white blood cell count, unspecified: Secondary | ICD-10-CM | POA: Diagnosis not present

## 2023-01-09 DIAGNOSIS — E041 Nontoxic single thyroid nodule: Secondary | ICD-10-CM | POA: Diagnosis not present

## 2023-01-09 DIAGNOSIS — K219 Gastro-esophageal reflux disease without esophagitis: Secondary | ICD-10-CM | POA: Diagnosis not present

## 2023-01-09 DIAGNOSIS — I10 Essential (primary) hypertension: Secondary | ICD-10-CM | POA: Diagnosis not present

## 2023-01-09 DIAGNOSIS — E7849 Other hyperlipidemia: Secondary | ICD-10-CM | POA: Diagnosis not present

## 2023-01-09 DIAGNOSIS — M109 Gout, unspecified: Secondary | ICD-10-CM | POA: Diagnosis not present

## 2023-01-13 DIAGNOSIS — Z23 Encounter for immunization: Secondary | ICD-10-CM | POA: Diagnosis not present

## 2023-01-13 DIAGNOSIS — M109 Gout, unspecified: Secondary | ICD-10-CM | POA: Diagnosis not present

## 2023-01-13 DIAGNOSIS — E1165 Type 2 diabetes mellitus with hyperglycemia: Secondary | ICD-10-CM | POA: Diagnosis not present

## 2023-01-13 DIAGNOSIS — E782 Mixed hyperlipidemia: Secondary | ICD-10-CM | POA: Diagnosis not present

## 2023-01-13 DIAGNOSIS — E041 Nontoxic single thyroid nodule: Secondary | ICD-10-CM | POA: Diagnosis not present

## 2023-01-13 DIAGNOSIS — K219 Gastro-esophageal reflux disease without esophagitis: Secondary | ICD-10-CM | POA: Diagnosis not present

## 2023-01-13 DIAGNOSIS — Z6838 Body mass index (BMI) 38.0-38.9, adult: Secondary | ICD-10-CM | POA: Diagnosis not present

## 2023-01-13 DIAGNOSIS — Z789 Other specified health status: Secondary | ICD-10-CM | POA: Diagnosis not present

## 2023-01-13 DIAGNOSIS — E7849 Other hyperlipidemia: Secondary | ICD-10-CM | POA: Diagnosis not present

## 2023-01-13 DIAGNOSIS — I10 Essential (primary) hypertension: Secondary | ICD-10-CM | POA: Diagnosis not present

## 2023-02-09 ENCOUNTER — Encounter: Payer: Self-pay | Admitting: Internal Medicine

## 2023-05-02 DIAGNOSIS — H43393 Other vitreous opacities, bilateral: Secondary | ICD-10-CM | POA: Diagnosis not present

## 2023-05-08 DIAGNOSIS — E782 Mixed hyperlipidemia: Secondary | ICD-10-CM | POA: Diagnosis not present

## 2023-05-08 DIAGNOSIS — E1165 Type 2 diabetes mellitus with hyperglycemia: Secondary | ICD-10-CM | POA: Diagnosis not present

## 2023-05-08 DIAGNOSIS — I10 Essential (primary) hypertension: Secondary | ICD-10-CM | POA: Diagnosis not present

## 2023-05-08 DIAGNOSIS — M109 Gout, unspecified: Secondary | ICD-10-CM | POA: Diagnosis not present

## 2023-05-08 DIAGNOSIS — E7849 Other hyperlipidemia: Secondary | ICD-10-CM | POA: Diagnosis not present

## 2023-05-08 DIAGNOSIS — R5383 Other fatigue: Secondary | ICD-10-CM | POA: Diagnosis not present

## 2023-05-15 DIAGNOSIS — E049 Nontoxic goiter, unspecified: Secondary | ICD-10-CM | POA: Diagnosis not present

## 2023-05-15 DIAGNOSIS — I10 Essential (primary) hypertension: Secondary | ICD-10-CM | POA: Diagnosis not present

## 2023-05-15 DIAGNOSIS — Z6837 Body mass index (BMI) 37.0-37.9, adult: Secondary | ICD-10-CM | POA: Diagnosis not present

## 2023-05-15 DIAGNOSIS — E1165 Type 2 diabetes mellitus with hyperglycemia: Secondary | ICD-10-CM | POA: Diagnosis not present

## 2023-05-15 DIAGNOSIS — E785 Hyperlipidemia, unspecified: Secondary | ICD-10-CM | POA: Diagnosis not present

## 2023-06-06 DIAGNOSIS — Z1212 Encounter for screening for malignant neoplasm of rectum: Secondary | ICD-10-CM | POA: Diagnosis not present

## 2023-06-06 DIAGNOSIS — Z1211 Encounter for screening for malignant neoplasm of colon: Secondary | ICD-10-CM | POA: Diagnosis not present

## 2023-06-10 LAB — COLOGUARD: COLOGUARD: NEGATIVE

## 2023-07-20 DIAGNOSIS — H01005 Unspecified blepharitis left lower eyelid: Secondary | ICD-10-CM | POA: Diagnosis not present

## 2023-07-20 DIAGNOSIS — H01001 Unspecified blepharitis right upper eyelid: Secondary | ICD-10-CM | POA: Diagnosis not present

## 2023-07-20 DIAGNOSIS — H02831 Dermatochalasis of right upper eyelid: Secondary | ICD-10-CM | POA: Diagnosis not present

## 2023-07-20 DIAGNOSIS — H2511 Age-related nuclear cataract, right eye: Secondary | ICD-10-CM | POA: Diagnosis not present

## 2023-07-20 DIAGNOSIS — H01002 Unspecified blepharitis right lower eyelid: Secondary | ICD-10-CM | POA: Diagnosis not present

## 2023-07-20 DIAGNOSIS — H25812 Combined forms of age-related cataract, left eye: Secondary | ICD-10-CM | POA: Diagnosis not present

## 2023-07-20 DIAGNOSIS — H01004 Unspecified blepharitis left upper eyelid: Secondary | ICD-10-CM | POA: Diagnosis not present

## 2023-07-20 DIAGNOSIS — H18513 Endothelial corneal dystrophy, bilateral: Secondary | ICD-10-CM | POA: Diagnosis not present

## 2023-07-20 DIAGNOSIS — H02834 Dermatochalasis of left upper eyelid: Secondary | ICD-10-CM | POA: Diagnosis not present

## 2023-08-28 DIAGNOSIS — H2511 Age-related nuclear cataract, right eye: Secondary | ICD-10-CM | POA: Diagnosis not present

## 2023-08-29 ENCOUNTER — Other Ambulatory Visit: Payer: Self-pay

## 2023-08-29 ENCOUNTER — Encounter (HOSPITAL_COMMUNITY): Payer: Self-pay

## 2023-08-29 ENCOUNTER — Encounter (HOSPITAL_COMMUNITY)
Admission: RE | Admit: 2023-08-29 | Discharge: 2023-08-29 | Disposition: A | Discharge status: Home / Self Care | Source: Ambulatory Visit | Attending: Ophthalmology | Admitting: Ophthalmology

## 2023-08-29 HISTORY — DX: Gout, unspecified: M10.9

## 2023-08-29 NOTE — H&P (Signed)
 Surgical History & Physical  Patient Name: Beverly Foley  DOB: [birth year]  Surgery: Cataract extraction with intraocular lens implant phacoemulsification; Right Eye Surgeon: Lynwood Hermann MD Surgery Date: 09/04/2023 Pre-Op Date: 07/20/2023  HPI: A 9 Yr. old female patient 1. 1The patient is here for a Cataract Evaluation referred by Dr. Nicholaus. The patient complains of difficulty when reading fine print, books, newspaper, instructions etc., which began 6 months ago. Both eyes are affected. The episode is constant. The patient describes foggy symptoms affecting their eyes/vision. This is negatively affecting the patient's quality of life and the patient is unable to function adequately in life with the current level of vision. PCP PA-C Katie Skillman, A1C 7.5 when last checked 3 months ago. HPI Completed by Dr. Lynwood Hermann  Medical History: Cataracts  Diabetes High Blood Pressure  Review of Systems Cardiovascular High Blood Pressure Endocrine DMII All recorded systems are negative except as noted above.  Social Never Smoked  Medication Prednisolone-moxiflox-bromfen,  Vitamin D, Jardiance, Lisinopril-hydrochlorothiazide, Trulicity  Sx/Procedures Cholecystectomy, Tubal Ligation  Drug Allergies  Levaquin, Codeine, Augmentin  History & Physical: Heent: cataracts NECK: supple without bruits LUNGS: lungs clear to auscultation CV: regular rate and rhythm Abdomen: soft and non-tender  Impression & Plan: Assessment: 1.  NUCLEAR SCLEROSIS AGE RELATED; Right Eye (H25.11) 2.  COMBINED FORMS AGE RELATED CATARACT; Left Eye (H25.812) 3.  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  DERMATOCHALASIS, no surgery; Right Upper Lid, Left Upper Lid (H02.831, H02.834) 5.  BLEPHARITIS; Right Upper Lid, Right Lower Lid, Left Upper Lid, Left Lower Lid (H01.001, H01.002,H01.004,H01.005) 6.  CONJUNCTIVOCHALASIS; Both Eyes (Y88.176)  Plan: 1.  Cataract accounts for  the patient's decreased vision. This visual impairment is not correctable with a tolerable change in glasses or contact lenses. Cataract surgery with an implantation of a new lens should significantly improve the visual and functional status of the patient. Discussed all risks, benefits, alternatives, and potential complications. Discussed the procedures and recovery. Patient desires to have surgery. A-scan ordered and performed today for intra-ocular lens calculations. The surgery will be performed in order to improve vision for driving, reading, and for eye examinations. Recommend phacoemulsification with intra-ocular lens. Recommend Dextenza  for post-operative pain and inflammation. History of refractive Surgery: None Use of Eye Pressure Lowering Drops: No Right Eye worse - first. Dilates poorly - shugarcaine or Lidocaine +Omidira by protocol Malyugin Ring. Fuchs - Viscoat.  2.  Left Eye. Perform OD first, then OS. See above.  3.  Mid. Monitor Viscoat for surgery.  4.  Asymptomatic, recommend observation for now. Findings, prognosis and treatment options reviewed.  5.  Blepharitis is present - recommend regular lid cleaning.  6.  Asymptomatic.

## 2023-09-04 ENCOUNTER — Ambulatory Visit (HOSPITAL_BASED_OUTPATIENT_CLINIC_OR_DEPARTMENT_OTHER): Admitting: Anesthesiology

## 2023-09-04 ENCOUNTER — Ambulatory Visit (HOSPITAL_COMMUNITY)
Admission: RE | Admit: 2023-09-04 | Discharge: 2023-09-04 | Disposition: A | Discharge status: Home / Self Care | Attending: Ophthalmology | Admitting: Ophthalmology

## 2023-09-04 ENCOUNTER — Ambulatory Visit (HOSPITAL_COMMUNITY): Admitting: Anesthesiology

## 2023-09-04 ENCOUNTER — Encounter (HOSPITAL_COMMUNITY)
Admission: RE | Disposition: A | Discharge status: Home / Self Care | Payer: Self-pay | Source: Home / Self Care | Attending: Ophthalmology

## 2023-09-04 DIAGNOSIS — E1136 Type 2 diabetes mellitus with diabetic cataract: Secondary | ICD-10-CM | POA: Diagnosis not present

## 2023-09-04 DIAGNOSIS — I10 Essential (primary) hypertension: Secondary | ICD-10-CM | POA: Insufficient documentation

## 2023-09-04 DIAGNOSIS — H5711 Ocular pain, right eye: Secondary | ICD-10-CM | POA: Diagnosis not present

## 2023-09-04 DIAGNOSIS — Z87891 Personal history of nicotine dependence: Secondary | ICD-10-CM | POA: Diagnosis not present

## 2023-09-04 DIAGNOSIS — H2181 Floppy iris syndrome: Secondary | ICD-10-CM | POA: Insufficient documentation

## 2023-09-04 DIAGNOSIS — H2511 Age-related nuclear cataract, right eye: Secondary | ICD-10-CM | POA: Diagnosis not present

## 2023-09-04 HISTORY — PX: INSERTION, STENT, DRUG-ELUTING, LACRIMAL CANALICULUS: SHX7453

## 2023-09-04 HISTORY — PX: CATARACT EXTRACTION W/PHACO: SHX586

## 2023-09-04 LAB — GLUCOSE, CAPILLARY: Glucose-Capillary: 150 mg/dL — ABNORMAL HIGH (ref 70–99)

## 2023-09-04 SURGERY — PHACOEMULSIFICATION, CATARACT, WITH IOL INSERTION
Anesthesia: Monitor Anesthesia Care | Site: Eye | Laterality: Right

## 2023-09-04 MED ORDER — SODIUM CHLORIDE 0.9% FLUSH
INTRAVENOUS | Status: DC | PRN
  Administered 2023-09-04: 5 mL via INTRAVENOUS

## 2023-09-04 MED ORDER — LACTATED RINGERS IV SOLN: INTRAVENOUS | Status: DC

## 2023-09-04 MED ORDER — LIDOCAINE HCL (PF) 1 % IJ SOLN
INTRAMUSCULAR | Status: DC | PRN
  Administered 2023-09-04: 5 mL

## 2023-09-04 MED ORDER — MIDAZOLAM HCL 2 MG/2ML IJ SOLN
INTRAMUSCULAR | Status: AC
Start: 2023-09-04 — End: 2023-09-04
  Filled 2023-09-04: qty 2

## 2023-09-04 MED ORDER — TROPICAMIDE 1 % OP SOLN
1.0000 [drp] | OPHTHALMIC | Status: AC | PRN
  Administered 2023-09-04 (×3): 1 [drp] via OPHTHALMIC

## 2023-09-04 MED ORDER — DEXAMETHASONE 0.4 MG OP INST
VAGINAL_INSERT | OPHTHALMIC | Status: DC | PRN
  Administered 2023-09-04: .4 mg via OPHTHALMIC

## 2023-09-04 MED ORDER — POVIDONE-IODINE 5 % OP SOLN
OPHTHALMIC | Status: DC | PRN
  Administered 2023-09-04: 1 via OPHTHALMIC

## 2023-09-04 MED ORDER — STERILE WATER FOR IRRIGATION IR SOLN
Status: DC | PRN
  Administered 2023-09-04: 250 mL

## 2023-09-04 MED ORDER — MIDAZOLAM HCL 2 MG/2ML IJ SOLN
INTRAMUSCULAR | Status: DC | PRN
  Administered 2023-09-04: 1 mg via INTRAVENOUS

## 2023-09-04 MED ORDER — TETRACAINE HCL 0.5 % OP SOLN
1.0000 [drp] | OPHTHALMIC | Status: AC | PRN
  Administered 2023-09-04 (×3): 1 [drp] via OPHTHALMIC

## 2023-09-04 MED ORDER — SODIUM HYALURONATE 23MG/ML IO SOSY
PREFILLED_SYRINGE | INTRAOCULAR | Status: DC | PRN
  Administered 2023-09-04: .6 mL via INTRAOCULAR

## 2023-09-04 MED ORDER — DEXAMETHASONE 0.4 MG OP INST
VAGINAL_INSERT | OPHTHALMIC | Status: AC
  Filled 2023-09-04: qty 1

## 2023-09-04 MED ORDER — SODIUM HYALURONATE 10 MG/ML IO SOLUTION
PREFILLED_SYRINGE | INTRAOCULAR | Status: DC | PRN
  Administered 2023-09-04: .85 mL via INTRAOCULAR

## 2023-09-04 MED ORDER — PHENYLEPHRINE-KETOROLAC 1-0.3 % IO SOLN
INTRAOCULAR | Status: DC | PRN
  Administered 2023-09-04: 500 mL via OPHTHALMIC

## 2023-09-04 MED ORDER — PHENYLEPHRINE HCL 2.5 % OP SOLN
1.0000 [drp] | OPHTHALMIC | Status: AC | PRN
  Administered 2023-09-04 (×3): 1 [drp] via OPHTHALMIC

## 2023-09-04 MED ORDER — LIDOCAINE HCL 3.5 % OP GEL
1.0000 | Freq: Once | OPHTHALMIC | Status: AC
  Administered 2023-09-04: 1 via OPHTHALMIC

## 2023-09-04 MED ORDER — BSS IO SOLN
INTRAOCULAR | Status: DC | PRN
  Administered 2023-09-04: 15 mL via INTRAOCULAR

## 2023-09-04 SURGICAL SUPPLY — 12 items
CLOTH BEACON ORANGE TIMEOUT ST (SAFETY) ×1 IMPLANT
EYE SHIELD UNIVERSAL CLEAR (GAUZE/BANDAGES/DRESSINGS) IMPLANT
FEE CATARACT SUITE SIGHTPATH (MISCELLANEOUS) ×1 IMPLANT
GLOVE BIOGEL PI IND STRL 7.0 (GLOVE) ×2 IMPLANT
LENS IOL TECNIS EYHANCE 22.0 (Intraocular Lens) IMPLANT
NDL HYPO 18GX1.5 BLUNT FILL (NEEDLE) ×1 IMPLANT
NEEDLE HYPO 18GX1.5 BLUNT FILL (NEEDLE) ×1 IMPLANT
PAD ARMBOARD POSITIONER FOAM (MISCELLANEOUS) ×1 IMPLANT
RING MALYGIN 7.0 (MISCELLANEOUS) IMPLANT
SYR TB 1ML LL NO SAFETY (SYRINGE) ×1 IMPLANT
TAPE SURG TRANSPORE 1 IN (GAUZE/BANDAGES/DRESSINGS) IMPLANT
WATER STERILE IRR 250ML POUR (IV SOLUTION) ×1 IMPLANT

## 2023-09-04 NOTE — Op Note (Signed)
 Date of procedure: 09/04/23  Pre-operative diagnosis: Visually significant age-related nuclear cataract, Right Eye (H25.11), Poor dilation of the right eye  Post-operative diagnosis:  Visually significant age-related nuclear cataract, Right Eye (H25.11) Intra-operative floppy iris syndrome, Right eye (H21.81) 3.   Pain and inflammation following cataract surgery, Right Eye (H57.11)  Procedure:  Complex Removal of cataract via phacoemulsification and insertion of intra-ocular lens Johnson and Johnson DIB00 +22.0D into the capsular bag of the Right Eye [phone]) 2. Placement of Dextenza  Implant, Right Lower Lid  Attending surgeon: Lynwood LABOR. Bao Coreas, MD, MA  Anesthesia: MAC, Topical Akten   Complications: None  Estimated Blood Loss: <47mL (minimal)  Specimens: None  Implants: As above  Indications:  Visually significant age-related cataract, Right Eye  Procedure:  The patient was seen and identified in the pre-operative area. The operative eye was identified and dilated.  The operative eye was marked.  Topical anesthesia was administered to the operative eye.     The patient was then to the operative suite and placed in the supine position.  A timeout was performed confirming the patient, procedure to be performed, and all other relevant information.   The patient's face was prepped and draped in the usual fashion for intra-ocular surgery.  A lid speculum was placed into the operative eye and the surgical microscope moved into place and focused. Poor dilation of the iris was confirmed. An superotemporal paracentesis was created using a 20 gauge paracentesis blade.  Shugarcaine was injected into the anterior chamber.  Viscoelastic was injected into the anterior chamber.  A temporal clear-corneal main wound incision was created using a 2.55mm microkeratome.  A 7.31mm Malyugin ring was placed. A continuous curvilinear capsulorrhexis was initiated using an irrigating cystitome and completed using  capsulorrhexis forceps.  Hydrodissection and hydrodeliniation were performed.  Viscoelastic was injected into the anterior chamber.  A phacoemulsification handpiece and a chopper as a second instrument were used to remove the nucleus and epinucleus. The irrigation/aspiration handpiece was used to remove any remaining cortical material.   The capsular bag was reinflated with viscoelastic, checked, and found to be intact.  The intraocular lens was inserted into the capsular bag. The Malyugin ring was removed. The irrigation/aspiration handpiece was used to remove any remaining viscoelastic.  The clear corneal wound and paracentesis wounds were then hydrated and checked with Weck-Cels to be watertight. The lid-speculum was removed.    The lower canaliculus was dilated and filled with Provisc. A Dextenza  implant was placed in the lower canaliculus without complication.  The drape was removed.  The patient's face was cleaned with a wet and dry 4x4.   A clear shield was taped over the eye. The patient was taken to the post-operative care unit in good condition, having tolerated the procedure well.  Post-Op Instructions: The patient will follow up at [facility] for a same day post-operative evaluation and will receive all other orders and instructions.

## 2023-09-04 NOTE — Transfer of Care (Signed)
 Immediate Anesthesia Transfer of Care Note  Patient: Beverly Foley  Procedure(s) Performed: PHACOEMULSIFICATION, CATARACT, WITH IOL INSERTION (Right: Eye) INSERTION, STENT, DRUG-ELUTING, LACRIMAL CANALICULUS (Right: Eye)  Patient Location: Short Stay  Anesthesia Type:MAC  Level of Consciousness: awake, alert , oriented, and patient cooperative  Airway & Oxygen Therapy: Patient Spontanous Breathing  Post-op Assessment: Report given to RN, Post -op Vital signs reviewed and stable, and Patient moving all extremities X 4  Post vital signs: Reviewed and stable  Last Vitals:  Vitals Value Taken Time  BP 139/85 09/04/23 09:58  Temp 36.8 C 09/04/23 09:58  Pulse 76 09/04/23 09:58  Resp 15 09/04/23 09:58  SpO2 100 % 09/04/23 09:58    Last Pain:  Vitals:   09/04/23 0958  TempSrc: Oral  PainSc: 0-No pain      Patients Stated Pain Goal: 6 (09/04/23 0829)  Complications: No notable events documented.

## 2023-09-04 NOTE — Interval H&P Note (Signed)
 History and Physical Interval Note:  09/04/2023 9:28 AM  Beverly Foley  has presented today for surgery, with the diagnosis of nuclear sclerotic cataract, right eye.  The various methods of treatment have been discussed with the patient and family. After consideration of risks, benefits and other options for treatment, the patient has consented to  Procedure(s): PHACOEMULSIFICATION, CATARACT, WITH IOL INSERTION (Right) INSERTION, STENT, DRUG-ELUTING, LACRIMAL CANALICULUS (Right) as a surgical intervention.  The patient's history has been reviewed, patient examined, no change in status, stable for surgery.  I have reviewed the patient's chart and labs.  Questions were answered to the patient's satisfaction.     HARRIE AGENT

## 2023-09-04 NOTE — Anesthesia Preprocedure Evaluation (Addendum)
 Anesthesia Evaluation  Patient identified by MRN, date of birth, ID band Patient awake    Reviewed: Allergy & Precautions, H&P , NPO status , Patient's Chart, lab work & pertinent test results, reviewed documented beta blocker date and time   Airway Mallampati: II  TM Distance: >3 FB Neck ROM: full    Dental no notable dental hx.    Pulmonary former smoker   Pulmonary exam normal breath sounds clear to auscultation       Cardiovascular Exercise Tolerance: Good hypertension,  Rhythm:regular Rate:Normal     Neuro/Psych negative neurological ROS  negative psych ROS   GI/Hepatic negative GI ROS, Neg liver ROS,,,  Endo/Other  diabetes    Renal/GU negative Renal ROS  negative genitourinary   Musculoskeletal   Abdominal   Peds  Hematology negative hematology ROS (+)   Anesthesia Other Findings   Reproductive/Obstetrics negative OB ROS                             Anesthesia Physical Anesthesia Plan  ASA: 2  Anesthesia Plan: MAC   Post-op Pain Management:    Induction:   PONV Risk Score and Plan:   Airway Management Planned:   Additional Equipment:   Intra-op Plan:   Post-operative Plan:   Informed Consent: I have reviewed the patients History and Physical, chart, labs and discussed the procedure including the risks, benefits and alternatives for the proposed anesthesia with the patient or authorized representative who has indicated his/her understanding and acceptance.     Dental Advisory Given  Plan Discussed with: CRNA  Anesthesia Plan Comments:        Anesthesia Quick Evaluation

## 2023-09-04 NOTE — Discharge Instructions (Signed)
 Please discharge patient when stable, will follow up today with Dr. June Leap at the [facility] office immediately following discharge.  Leave shield in place until visit.  All paperwork with discharge instructions will be given at the office.  [facility] Address:  [address]  [place], [place] [address]

## 2023-09-05 ENCOUNTER — Encounter (HOSPITAL_COMMUNITY): Payer: Self-pay | Admitting: Ophthalmology

## 2023-09-09 NOTE — Anesthesia Postprocedure Evaluation (Signed)
 Anesthesia Post Note  Patient: Beverly Foley  Procedure(s) Performed: PHACOEMULSIFICATION, CATARACT, WITH IOL INSERTION (Right: Eye) INSERTION, STENT, DRUG-ELUTING, LACRIMAL CANALICULUS (Right: Eye)  Patient location during evaluation: Phase II Anesthesia Type: MAC Level of consciousness: awake Pain management: pain level controlled Vital Signs Assessment: post-procedure vital signs reviewed and stable Respiratory status: spontaneous breathing and respiratory function stable Cardiovascular status: blood pressure returned to baseline and stable Postop Assessment: no headache and no apparent nausea or vomiting Anesthetic complications: no Comments: Late entry   No notable events documented.   Last Vitals:  Vitals:   09/04/23 0829 09/04/23 0958  BP: 118/74 139/85  Pulse: 73 76  Resp:  15  Temp: 36.8 C 36.8 C  SpO2: 100% 100%    Last Pain:  Vitals:   09/04/23 0958  TempSrc: Oral  PainSc: 0-No pain                 Yvonna JINNY Bosworth

## 2023-09-15 ENCOUNTER — Other Ambulatory Visit (HOSPITAL_COMMUNITY)

## 2023-09-18 ENCOUNTER — Ambulatory Visit (HOSPITAL_COMMUNITY): Admit: 2023-09-18 | Admitting: Ophthalmology

## 2023-09-18 SURGERY — PHACOEMULSIFICATION, CATARACT, WITH IOL INSERTION
Anesthesia: Monitor Anesthesia Care | Laterality: Left

## 2023-11-13 DIAGNOSIS — M109 Gout, unspecified: Secondary | ICD-10-CM | POA: Diagnosis not present

## 2023-11-13 DIAGNOSIS — E1165 Type 2 diabetes mellitus with hyperglycemia: Secondary | ICD-10-CM | POA: Diagnosis not present

## 2023-11-13 DIAGNOSIS — Z130 Encounter for screening for diseases of the blood and blood-forming organs and certain disorders involving the immune mechanism: Secondary | ICD-10-CM | POA: Diagnosis not present

## 2023-11-13 DIAGNOSIS — R5383 Other fatigue: Secondary | ICD-10-CM | POA: Diagnosis not present

## 2023-11-13 DIAGNOSIS — H25812 Combined forms of age-related cataract, left eye: Secondary | ICD-10-CM | POA: Diagnosis not present

## 2023-11-13 DIAGNOSIS — E785 Hyperlipidemia, unspecified: Secondary | ICD-10-CM | POA: Diagnosis not present

## 2023-11-13 DIAGNOSIS — I10 Essential (primary) hypertension: Secondary | ICD-10-CM | POA: Diagnosis not present

## 2023-11-15 ENCOUNTER — Encounter (HOSPITAL_COMMUNITY)
Admission: RE | Admit: 2023-11-15 | Discharge: 2023-11-15 | Disposition: A | Discharge status: Home / Self Care | Source: Ambulatory Visit | Attending: Ophthalmology | Admitting: Ophthalmology

## 2023-11-15 ENCOUNTER — Encounter (HOSPITAL_COMMUNITY): Payer: Self-pay

## 2023-11-15 ENCOUNTER — Other Ambulatory Visit: Payer: Self-pay

## 2023-11-15 NOTE — H&P (Signed)
 Surgical History & Physical  Patient Name: Beverly Foley  DOB: 06/25/1950  Surgery: Cataract extraction with intraocular lens implant phacoemulsification; Left Eye Surgeon: Lynwood Hermann MD Surgery Date: 11/17/2023 Pre-Op Date: 11/09/2023  HPI: A 45 Yr. old female patient 1. The patient is returning after cataract surgery. The right eye is affected. Status post cataract surgery, which began 2 months ago: Since the last visit, the affected area feels improvement. The patient's vision is improved. The condition's severity is constant. Patient has finished meds as instructed. 2. The patient is returning for a cataract follow-up of the left eye. Since the last visit, the affected area is tolerating. The patient's vision is blurry. The condition's severity is constant. Patient is not taking medications. This is negatively affecting the patient's quality of life and the patient is unable to function adequately in life with the current level of vision. HPI was performed by Lynwood Hermann .  Medical History: Cataracts  Diabetes High Blood Pressure  Review of Systems Cardiovascular High Blood Pressure Endocrine DMII All recorded systems are negative except as noted above.  Social Never Smoked  Medication Prednisolone-moxiflox-bromfen,  Vitamin D, Jardiance, Lisinopril-hydrochlorothiazide, Trulicity   Sx/Procedures Phaco c IOL OD - Dextenza ,  Cholecystectomy, Tubal Ligation  Drug Allergies  Levaquin, Codeine, Augmentin, Levaquin  History & Physical: Heent: cataract NECK: supple without bruits LUNGS: lungs clear to auscultation CV: regular rate and rhythm Abdomen: soft and non-tender  Impression & Plan: Assessment: 1.  CATARACT EXTRACTION STATUS; Right Eye (Z98.41) 2.  NUCLEAR SCLEROSIS AGE RELATED; Left Eye (H25.12) 3.  COMBINED FORMS AGE RELATED CATARACT; Left Eye (H25.812)  Plan: 1.  2 months after Phaco PCIOL. Doing very well with improved vision and normal IOP. Stop all  post-operative medications. Call with any concerning symptoms.  2.  See above  3.  Cataract accounts for the patient's decreased vision. This visual impairment is not correctable with a tolerable change in glasses or contact lenses. Cataract surgery with an implantation of a new lens should significantly improve the visual and functional status of the patient. Discussed all risks, benefits, alternatives, and potential complications. Discussed the procedures and recovery. Patient desires to have surgery. A-scan ordered and performed today for intra-ocular lens calculations. The surgery will be performed in order to improve vision for driving, reading, and for eye examinations. Recommend phacoemulsification with intra-ocular lens. Recommend Dextenza  for post-operative pain and inflammation. History of refractive Surgery: None Use of Eye Pressure Lowering Drops: No Left Eye.. Dilates poorly - shugarcaine or Lidocaine +Omidira by protocol Malyugin Ring. Fuchs - Viscoat.

## 2023-11-17 ENCOUNTER — Ambulatory Visit (HOSPITAL_COMMUNITY)
Admission: RE | Admit: 2023-11-17 | Discharge: 2023-11-17 | Disposition: A | Discharge status: Home / Self Care | Attending: Ophthalmology | Admitting: Ophthalmology

## 2023-11-17 ENCOUNTER — Encounter (HOSPITAL_COMMUNITY)
Admission: RE | Disposition: A | Discharge status: Home / Self Care | Payer: Self-pay | Source: Home / Self Care | Attending: Ophthalmology

## 2023-11-17 ENCOUNTER — Ambulatory Visit (HOSPITAL_COMMUNITY): Admitting: Student

## 2023-11-17 ENCOUNTER — Encounter (HOSPITAL_COMMUNITY): Payer: Self-pay | Admitting: Ophthalmology

## 2023-11-17 DIAGNOSIS — H25812 Combined forms of age-related cataract, left eye: Secondary | ICD-10-CM

## 2023-11-17 DIAGNOSIS — I10 Essential (primary) hypertension: Secondary | ICD-10-CM | POA: Diagnosis not present

## 2023-11-17 DIAGNOSIS — E1136 Type 2 diabetes mellitus with diabetic cataract: Secondary | ICD-10-CM | POA: Diagnosis not present

## 2023-11-17 DIAGNOSIS — Z7984 Long term (current) use of oral hypoglycemic drugs: Secondary | ICD-10-CM | POA: Insufficient documentation

## 2023-11-17 DIAGNOSIS — H5712 Ocular pain, left eye: Secondary | ICD-10-CM | POA: Diagnosis not present

## 2023-11-17 DIAGNOSIS — Z87891 Personal history of nicotine dependence: Secondary | ICD-10-CM | POA: Insufficient documentation

## 2023-11-17 DIAGNOSIS — H2512 Age-related nuclear cataract, left eye: Secondary | ICD-10-CM | POA: Diagnosis not present

## 2023-11-17 DIAGNOSIS — H2181 Floppy iris syndrome: Secondary | ICD-10-CM | POA: Diagnosis not present

## 2023-11-17 HISTORY — PX: INSERTION, STENT, DRUG-ELUTING, LACRIMAL CANALICULUS: SHX7453

## 2023-11-17 HISTORY — PX: CATARACT EXTRACTION W/PHACO: SHX586

## 2023-11-17 LAB — GLUCOSE, CAPILLARY: Glucose-Capillary: 114 mg/dL — ABNORMAL HIGH (ref 70–99)

## 2023-11-17 SURGERY — PHACOEMULSIFICATION, CATARACT, WITH IOL INSERTION
Anesthesia: Monitor Anesthesia Care | Site: Eye | Laterality: Left

## 2023-11-17 MED ORDER — BSS IO SOLN
INTRAOCULAR | Status: DC | PRN
  Administered 2023-11-17: 15 mL via INTRAOCULAR

## 2023-11-17 MED ORDER — TROPICAMIDE 1 % OP SOLN
1.0000 [drp] | OPHTHALMIC | Status: AC | PRN
  Administered 2023-11-17 (×3): 1 [drp] via OPHTHALMIC

## 2023-11-17 MED ORDER — MIDAZOLAM HCL (PF) 2 MG/2ML IJ SOLN
INTRAMUSCULAR | Status: DC | PRN
Start: 2023-11-17 — End: 2023-11-17
  Administered 2023-11-17: 1 mg via INTRAVENOUS

## 2023-11-17 MED ORDER — LACTATED RINGERS IV SOLN: INTRAVENOUS | Status: DC

## 2023-11-17 MED ORDER — BSS IO SOLN
INTRAOCULAR | Status: DC | PRN
  Administered 2023-11-17: 500 mL via OPHTHALMIC

## 2023-11-17 MED ORDER — TETRACAINE HCL 0.5 % OP SOLN
1.0000 [drp] | OPHTHALMIC | Status: AC | PRN
  Administered 2023-11-17 (×3): 1 [drp] via OPHTHALMIC

## 2023-11-17 MED ORDER — POVIDONE-IODINE 5 % OP SOLN
OPHTHALMIC | Status: DC | PRN
  Administered 2023-11-17: 1 via OPHTHALMIC

## 2023-11-17 MED ORDER — LIDOCAINE HCL (PF) 1 % IJ SOLN
INTRAMUSCULAR | Status: DC | PRN
  Administered 2023-11-17: 1 mL

## 2023-11-17 MED ORDER — MIDAZOLAM HCL 2 MG/2ML IJ SOLN
INTRAMUSCULAR | Status: AC
Start: 2023-11-17 — End: 2023-11-17
  Filled 2023-11-17: qty 2

## 2023-11-17 MED ORDER — SIGHTPATH DOSE#1 NA HYALUR & NA CHOND-NA HYALUR IO KIT
PACK | INTRAOCULAR | Status: DC | PRN
  Administered 2023-11-17: 1 via OPHTHALMIC

## 2023-11-17 MED ORDER — DEXAMETHASONE 0.4 MG OP INST
VAGINAL_INSERT | OPHTHALMIC | Status: AC
  Filled 2023-11-17: qty 1

## 2023-11-17 MED ORDER — PHENYLEPHRINE HCL 2.5 % OP SOLN
1.0000 [drp] | OPHTHALMIC | Status: AC | PRN
  Administered 2023-11-17 (×3): 1 [drp] via OPHTHALMIC

## 2023-11-17 MED ORDER — DEXAMETHASONE 0.4 MG OP INST
VAGINAL_INSERT | OPHTHALMIC | Status: DC | PRN
Start: 2023-11-17 — End: 2023-11-17
  Administered 2023-11-17: .4 mg via OPHTHALMIC

## 2023-11-17 MED ORDER — STERILE WATER FOR IRRIGATION IR SOLN
Status: DC | PRN
  Administered 2023-11-17: 1

## 2023-11-17 MED ORDER — LIDOCAINE HCL 3.5 % OP GEL
1.0000 | Freq: Once | OPHTHALMIC | Status: AC
  Administered 2023-11-17: 1 via OPHTHALMIC

## 2023-11-17 SURGICAL SUPPLY — 12 items
CLOTH BEACON ORANGE TIMEOUT ST (SAFETY) ×1 IMPLANT
EYE SHIELD UNIVERSAL CLEAR (GAUZE/BANDAGES/DRESSINGS) IMPLANT
FEE CATARACT SUITE SIGHTPATH (MISCELLANEOUS) ×1 IMPLANT
GLOVE BIOGEL PI IND STRL 7.0 (GLOVE) ×2 IMPLANT
LENS IOL TECNIS EYHANCE 21.0 (Intraocular Lens) IMPLANT
NDL HYPO 18GX1.5 BLUNT FILL (NEEDLE) ×1 IMPLANT
NEEDLE HYPO 18GX1.5 BLUNT FILL (NEEDLE) ×1 IMPLANT
PAD ARMBOARD POSITIONER FOAM (MISCELLANEOUS) ×1 IMPLANT
RING MALYGIN 7.0 (MISCELLANEOUS) IMPLANT
SYR TB 1ML LL NO SAFETY (SYRINGE) ×1 IMPLANT
TAPE SURG TRANSPORE 1 IN (GAUZE/BANDAGES/DRESSINGS) IMPLANT
WATER STERILE IRR 250ML POUR (IV SOLUTION) ×1 IMPLANT

## 2023-11-17 NOTE — Discharge Instructions (Addendum)
 Please discharge patient when stable, will follow up today with Dr. June Leap at the [facility] office immediately following discharge.  Leave shield in place until visit.  All paperwork with discharge instructions will be given at the office.  [facility] Address:  [address]  [place], [place] [address]

## 2023-11-17 NOTE — Anesthesia Preprocedure Evaluation (Signed)
 Anesthesia Evaluation  Patient identified by MRN, date of birth, ID band Patient awake    Reviewed: Allergy & Precautions, H&P , NPO status , Patient's Chart, lab work & pertinent test results, reviewed documented beta blocker date and time   Airway Mallampati: II  TM Distance: >3 FB Neck ROM: full    Dental no notable dental hx. (+) Dental Advisory Given, Teeth Intact   Pulmonary former smoker   Pulmonary exam normal breath sounds clear to auscultation       Cardiovascular Exercise Tolerance: Good hypertension, Normal cardiovascular exam Rhythm:regular Rate:Normal     Neuro/Psych negative neurological ROS  negative psych ROS   GI/Hepatic negative GI ROS, Neg liver ROS,,,  Endo/Other  diabetes, Type 2    Renal/GU negative Renal ROS  negative genitourinary   Musculoskeletal   Abdominal   Peds  Hematology negative hematology ROS (+)   Anesthesia Other Findings   Reproductive/Obstetrics negative OB ROS                              Anesthesia Physical Anesthesia Plan  ASA: 2  Anesthesia Plan: MAC   Post-op Pain Management:    Induction:   PONV Risk Score and Plan: Midazolam   Airway Management Planned: Natural Airway and Nasal Cannula  Additional Equipment: None  Intra-op Plan:   Post-operative Plan:   Informed Consent: I have reviewed the patients History and Physical, chart, labs and discussed the procedure including the risks, benefits and alternatives for the proposed anesthesia with the patient or authorized representative who has indicated his/her understanding and acceptance.     Dental Advisory Given  Plan Discussed with: CRNA  Anesthesia Plan Comments:          Anesthesia Quick Evaluation

## 2023-11-17 NOTE — Transfer of Care (Signed)
 Immediate Anesthesia Transfer of Care Note  Patient: Beverly Foley  Procedure(s) Performed: PHACOEMULSIFICATION, CATARACT, WITH IOL INSERTION (Left: Eye) INSERTION, STENT, DRUG-ELUTING, LACRIMAL CANALICULUS (Left: Eye)  Patient Location: PACU  Anesthesia Type:MAC  Level of Consciousness: awake  Airway & Oxygen Therapy: Patient Spontanous Breathing  Post-op Assessment: Report given to RN  Post vital signs: Reviewed  Last Vitals:  Vitals Value Taken Time  BP 125/58   Temp 98   Pulse 74   Resp 12   SpO2 97     Last Pain: There were no vitals filed for this visit.       Complications: There were no known notable events for this encounter.

## 2023-11-17 NOTE — Op Note (Signed)
 Date of procedure: 11/17/23  Pre-operative diagnosis: Visually significant age-related combined cataract, Left Eye (H25.812)  Post-operative diagnosis:  Visually significant age-related combined cataract, Left Eye (H25.812) 2.   Pain and inflammation following cataract surgery, Left Eye (H57.12)  Procedure:  Removal of cataract via phacoemulsification and insertion of intra-ocular lens Johnson and Johnson DIB00 +21.0D into the capsular bag of the Left Eye 2. Placement of Dextenza  Implant, Left Lower Lid  Attending surgeon: Lynwood LABOR. Dail Lerew, MD, MA  Anesthesia: MAC, Topical Akten   Complications: None  Estimated Blood Loss: <18mL (minimal)  Specimens: None  Implants: As above  Indications:  Visually significant age-related cataract, Left Eye  Procedure:  The patient was seen and identified in the pre-operative area. The operative eye was identified and dilated.  The operative eye was marked.  Topical anesthesia was administered to the operative eye.     The patient was then to the operative suite and placed in the supine position.  A timeout was performed confirming the patient, procedure to be performed, and all other relevant information.   The patient's face was prepped and draped in the usual fashion for intra-ocular surgery.  A lid speculum was placed into the operative eye and the surgical microscope moved into place and focused.  An inferotemporal paracentesis was created using a 20 gauge paracentesis blade. Omidria  was injected into the anterior chamber. Shugarcaine was injected into the anterior chamber.  Viscoelastic was injected into the anterior chamber.  A temporal clear-corneal main wound incision was created using a 2.75mm microkeratome.  A continuous curvilinear capsulorrhexis was initiated using an irrigating cystitome and completed using capsulorrhexis forceps.  Hydrodissection and hydrodeliniation were performed.  Viscoelastic was injected into the anterior chamber.  A  phacoemulsification handpiece and a chopper as a second instrument were used to remove the nucleus and epinucleus. The irrigation/aspiration handpiece was used to remove any remaining cortical material.   The capsular bag was reinflated with viscoelastic, checked, and found to be intact.  The intraocular lens was inserted into the capsular bag.  The irrigation/aspiration handpiece was used to remove any remaining viscoelastic.  The clear corneal wound and paracentesis wounds were then hydrated and checked with Weck-Cels to be watertight.  The lid-speculum was removed. The lower punctum was dilated. A Dextenza  implant was placed in the lower canaliculus without complication.   The drape was removed.  The patient's face was cleaned with a wet and dry 4x4.   A clear shield was taped over the eye. The patient was taken to the post-operative care unit in good condition, having tolerated the procedure well.  Post-Op Instructions: The patient will follow up at [facility] for a same day post-operative evaluation and will receive all other orders and instructions.

## 2023-11-17 NOTE — Anesthesia Postprocedure Evaluation (Signed)
 Anesthesia Post Note  Patient: TALON WITTING  Procedure(s) Performed: PHACOEMULSIFICATION, CATARACT, WITH IOL INSERTION (Left: Eye) INSERTION, STENT, DRUG-ELUTING, LACRIMAL CANALICULUS (Left: Eye)  Patient location during evaluation: Phase II Anesthesia Type: MAC Level of consciousness: awake and alert Pain management: pain level controlled Vital Signs Assessment: post-procedure vital signs reviewed and stable Respiratory status: spontaneous breathing, nonlabored ventilation and respiratory function stable Cardiovascular status: stable Anesthetic complications: no   There were no known notable events for this encounter.   Last Vitals:  Vitals:   11/17/23 1054 11/17/23 1102  BP: 99/83 111/76  Pulse: 70   Resp: 14   Temp: 36.7 C   SpO2: 95%     Last Pain:  Vitals:   11/17/23 1054  TempSrc: Oral  PainSc: 0-No pain                 Adylene Dlugosz L Teresha Hanks

## 2023-11-17 NOTE — Interval H&P Note (Signed)
 History and Physical Interval Note:  11/17/2023 10:23 AM  Beverly Foley  has presented today for surgery, with the diagnosis of combined forms age related cataract, left eye.  The various methods of treatment have been discussed with the patient and family. After consideration of risks, benefits and other options for treatment, the patient has consented to  Procedure(s): PHACOEMULSIFICATION, CATARACT, WITH IOL INSERTION (Left) INSERTION, STENT, DRUG-ELUTING, LACRIMAL CANALICULUS (Left) as a surgical intervention.  The patient's history has been reviewed, patient examined, no change in status, stable for surgery.  I have reviewed the patient's chart and labs.  Questions were answered to the patient's satisfaction.     HARRIE AGENT

## 2023-11-20 ENCOUNTER — Encounter (HOSPITAL_COMMUNITY): Payer: Self-pay | Admitting: Ophthalmology

## 2023-11-20 DIAGNOSIS — Z23 Encounter for immunization: Secondary | ICD-10-CM | POA: Diagnosis not present

## 2023-11-20 DIAGNOSIS — Z6838 Body mass index (BMI) 38.0-38.9, adult: Secondary | ICD-10-CM | POA: Diagnosis not present

## 2023-11-20 DIAGNOSIS — I10 Essential (primary) hypertension: Secondary | ICD-10-CM | POA: Diagnosis not present

## 2023-11-20 DIAGNOSIS — E785 Hyperlipidemia, unspecified: Secondary | ICD-10-CM | POA: Diagnosis not present

## 2023-11-20 DIAGNOSIS — M109 Gout, unspecified: Secondary | ICD-10-CM | POA: Diagnosis not present

## 2023-11-20 DIAGNOSIS — E049 Nontoxic goiter, unspecified: Secondary | ICD-10-CM | POA: Diagnosis not present

## 2023-11-20 DIAGNOSIS — F419 Anxiety disorder, unspecified: Secondary | ICD-10-CM | POA: Diagnosis not present

## 2023-11-20 DIAGNOSIS — E1165 Type 2 diabetes mellitus with hyperglycemia: Secondary | ICD-10-CM | POA: Diagnosis not present

## 2023-11-20 DIAGNOSIS — K219 Gastro-esophageal reflux disease without esophagitis: Secondary | ICD-10-CM | POA: Diagnosis not present
